# Patient Record
Sex: Male | Born: 1970 | Race: Black or African American | Hispanic: No | Marital: Single | Smoking: Never smoker
Health system: Southern US, Community
[De-identification: ages and names within clinical notes are randomized; demographics above are authoritative.]

## PROBLEM LIST (undated history)

## (undated) DIAGNOSIS — I10 Essential (primary) hypertension: Secondary | ICD-10-CM

## (undated) DIAGNOSIS — N5089 Other specified disorders of the male genital organs: Secondary | ICD-10-CM

## (undated) DIAGNOSIS — I639 Cerebral infarction, unspecified: Secondary | ICD-10-CM

## (undated) DIAGNOSIS — N451 Epididymitis: Secondary | ICD-10-CM

## (undated) DIAGNOSIS — E559 Vitamin D deficiency, unspecified: Secondary | ICD-10-CM

## (undated) DIAGNOSIS — R369 Urethral discharge, unspecified: Secondary | ICD-10-CM

## (undated) DIAGNOSIS — F32A Depression, unspecified: Secondary | ICD-10-CM

## (undated) DIAGNOSIS — A6000 Herpesviral infection of urogenital system, unspecified: Secondary | ICD-10-CM

## (undated) DIAGNOSIS — N4340 Spermatocele of epididymis, unspecified: Secondary | ICD-10-CM

## (undated) DIAGNOSIS — N401 Enlarged prostate with lower urinary tract symptoms: Secondary | ICD-10-CM

## (undated) DIAGNOSIS — N138 Other obstructive and reflux uropathy: Secondary | ICD-10-CM

## (undated) DIAGNOSIS — A64 Unspecified sexually transmitted disease: Secondary | ICD-10-CM

## (undated) DIAGNOSIS — F329 Major depressive disorder, single episode, unspecified: Secondary | ICD-10-CM

## (undated) HISTORY — PX: REFRACTIVE SURGERY: SHX103

## (undated) HISTORY — DX: Other obstructive and reflux uropathy: N40.1

## (undated) HISTORY — DX: Urethral discharge, unspecified: R36.9

## (undated) HISTORY — DX: Major depressive disorder, single episode, unspecified: F32.9

## (undated) HISTORY — DX: Depression, unspecified: F32.A

## (undated) HISTORY — DX: Other specified disorders of the male genital organs: N50.89

## (undated) HISTORY — DX: Spermatocele of epididymis, unspecified: N43.40

## (undated) HISTORY — DX: Herpesviral infection of urogenital system, unspecified: A60.00

## (undated) HISTORY — DX: Epididymitis: N45.1

## (undated) HISTORY — DX: Unspecified sexually transmitted disease: A64

## (undated) HISTORY — DX: Essential (primary) hypertension: I10

## (undated) HISTORY — DX: Other obstructive and reflux uropathy: N13.8

## (undated) HISTORY — PX: CIRCUMCISION: SUR203

---

## 2004-10-25 ENCOUNTER — Emergency Department: Payer: Self-pay | Admitting: Emergency Medicine

## 2004-11-02 ENCOUNTER — Emergency Department: Payer: Self-pay | Admitting: Emergency Medicine

## 2005-08-08 ENCOUNTER — Emergency Department: Payer: Self-pay | Admitting: Unknown Physician Specialty

## 2010-04-04 ENCOUNTER — Emergency Department: Payer: Self-pay | Admitting: Emergency Medicine

## 2011-08-12 ENCOUNTER — Emergency Department: Payer: Self-pay | Admitting: Emergency Medicine

## 2012-12-29 ENCOUNTER — Ambulatory Visit: Payer: Self-pay

## 2014-10-26 ENCOUNTER — Ambulatory Visit: Payer: Self-pay | Admitting: Urology

## 2015-09-15 ENCOUNTER — Other Ambulatory Visit: Payer: Self-pay | Admitting: Neurology

## 2015-09-15 DIAGNOSIS — R413 Other amnesia: Secondary | ICD-10-CM

## 2015-10-06 ENCOUNTER — Ambulatory Visit
Admission: RE | Admit: 2015-10-06 | Discharge: 2015-10-06 | Disposition: A | Discharge status: Home / Self Care | Payer: Managed Care, Other (non HMO) | Source: Ambulatory Visit | Attending: Neurology | Admitting: Neurology

## 2015-10-06 DIAGNOSIS — R410 Disorientation, unspecified: Secondary | ICD-10-CM | POA: Insufficient documentation

## 2015-10-06 DIAGNOSIS — R413 Other amnesia: Secondary | ICD-10-CM

## 2015-10-06 DIAGNOSIS — R4189 Other symptoms and signs involving cognitive functions and awareness: Secondary | ICD-10-CM | POA: Diagnosis present

## 2015-10-06 DIAGNOSIS — G9389 Other specified disorders of brain: Secondary | ICD-10-CM | POA: Diagnosis not present

## 2015-11-08 ENCOUNTER — Ambulatory Visit: Payer: Self-pay | Admitting: Urology

## 2015-11-10 ENCOUNTER — Encounter: Payer: Self-pay | Admitting: *Deleted

## 2015-11-21 ENCOUNTER — Ambulatory Visit: Payer: Self-pay | Admitting: Urology

## 2021-06-09 HISTORY — PX: COLONOSCOPY: SHX174

## 2021-07-04 ENCOUNTER — Inpatient Hospital Stay: Payer: Commercial Managed Care - PPO

## 2021-07-04 ENCOUNTER — Inpatient Hospital Stay: Payer: Commercial Managed Care - PPO | Attending: Oncology | Admitting: Oncology

## 2021-07-04 ENCOUNTER — Other Ambulatory Visit: Payer: Self-pay

## 2021-07-04 ENCOUNTER — Encounter: Payer: Self-pay | Admitting: Oncology

## 2021-07-04 VITALS — BP 166/106 | HR 64 | Temp 96.9°F | Ht 69.0 in | Wt 203.0 lb

## 2021-07-04 DIAGNOSIS — C20 Malignant neoplasm of rectum: Secondary | ICD-10-CM

## 2021-07-04 LAB — CBC WITH DIFFERENTIAL/PLATELET
Abs Immature Granulocytes: 0.03 10*3/uL (ref 0.00–0.07)
Basophils Absolute: 0.1 10*3/uL (ref 0.0–0.1)
Basophils Relative: 1 %
Eosinophils Absolute: 0.2 10*3/uL (ref 0.0–0.5)
Eosinophils Relative: 2 %
HCT: 39.5 % (ref 39.0–52.0)
Hemoglobin: 12.9 g/dL — ABNORMAL LOW (ref 13.0–17.0)
Immature Granulocytes: 0 %
Lymphocytes Relative: 30 %
Lymphs Abs: 2.2 10*3/uL (ref 0.7–4.0)
MCH: 26.9 pg (ref 26.0–34.0)
MCHC: 32.7 g/dL (ref 30.0–36.0)
MCV: 82.3 fL (ref 80.0–100.0)
Monocytes Absolute: 0.5 10*3/uL (ref 0.1–1.0)
Monocytes Relative: 6 %
Neutro Abs: 4.5 10*3/uL (ref 1.7–7.7)
Neutrophils Relative %: 61 %
Platelets: 225 10*3/uL (ref 150–400)
RBC: 4.8 MIL/uL (ref 4.22–5.81)
RDW: 13.6 % (ref 11.5–15.5)
WBC: 7.5 10*3/uL (ref 4.0–10.5)
nRBC: 0 % (ref 0.0–0.2)

## 2021-07-04 LAB — COMPREHENSIVE METABOLIC PANEL
ALT: 17 U/L (ref 0–44)
AST: 17 U/L (ref 15–41)
Albumin: 4 g/dL (ref 3.5–5.0)
Alkaline Phosphatase: 67 U/L (ref 38–126)
Anion gap: 6 (ref 5–15)
BUN: 17 mg/dL (ref 6–20)
CO2: 30 mmol/L (ref 22–32)
Calcium: 8.7 mg/dL — ABNORMAL LOW (ref 8.9–10.3)
Chloride: 100 mmol/L (ref 98–111)
Creatinine, Ser: 1.11 mg/dL (ref 0.61–1.24)
GFR, Estimated: >60 mL/min (ref >60)
Glucose, Bld: 107 mg/dL — ABNORMAL HIGH (ref 70–99)
Potassium: 3.6 mmol/L (ref 3.5–5.1)
Sodium: 136 mmol/L (ref 135–145)
Total Bilirubin: 0.3 mg/dL (ref 0.3–1.2)
Total Protein: 7.4 g/dL (ref 6.5–8.1)

## 2021-07-04 NOTE — Progress Notes (Signed)
Hematology/Oncology Consult note [facility] [phone] [phone]   Patient Care Team: [facility] as PCP - General Delia Chimes, Dorathy Daft, RN as Oncology Nurse Navigator  REFERRING PROVIDER: Geanie Kenning, [place]*  CHIEF COMPLAINTS/REASON FOR VISIT:  Evaluation of mid rectal cancer  HISTORY OF PRESENTING ILLNESS:   Joseph Valentine is a  50 y.o.  male with PMH listed below was seen in consultation at the request of  Geanie Kenning, [place]*  for evaluation of rectal cancer  Patient establish care with gastroenterology.  He has noticed occasional blood in the stool.  Symptoms are better after he changes his diet.  No intentional weight loss, abdominal pain. Patient was recommended to have colonoscopy screening.  He has no known family history of colon cancer or any cancer.  No inflammatory bowel disease 06/25/2021, patient underwent colonoscopy by Dr. Alice Reichert  Nonbleeding internal hemorrhoids 4 mm cecum polyp removed-pathology showed tubular adenoma 21mm polyp in the proximal sigmoid colon completely resected and partially retrieved.  Clip was placed.-Pathology showed tubular adenoma 8 mm polyp in the proximal rectum removed with a hot snare.-Pathology showed tubular adenoma 10 mm polyp in the mid rectum, removed with a hot snare.  Pathology showed tubulovillous adenoma Likely malignant tumor in the mid rectum, biopsied, tattooed.-Pathology showed moderately differentiated adenocarcinoma.  MSI testing in progress   Review of Systems  Constitutional:  Negative for appetite change, chills, fatigue, fever and unexpected weight change.  HENT:   Negative for hearing loss and voice change.   Eyes:  Negative for eye problems and icterus.  Respiratory:  Negative for chest tightness, cough and shortness of breath.   Cardiovascular:  Negative for chest pain and leg swelling.  Gastrointestinal:  Negative for abdominal  distention and abdominal pain.  Endocrine: Negative for hot flashes.  Genitourinary:  Negative for difficulty urinating, dysuria and frequency.   Musculoskeletal:  Negative for arthralgias.  Skin:  Negative for itching and rash.  Neurological:  Negative for light-headedness and numbness.  Hematological:  Negative for adenopathy. Does not bruise/bleed easily.  Psychiatric/Behavioral:  Negative for confusion.    MEDICAL HISTORY:  Past Medical History:  Diagnosis Date   BPH with obstruction/lower urinary tract symptoms    Depression    Epididymal mass    Epididymitis    Genital herpes    Type ll   HTN (hypertension)    Penile discharge    Spermatocele    Swelling of the testicles    Venereal disease     SURGICAL HISTORY: Past Surgical History:  Procedure Laterality Date   CIRCUMCISION      SOCIAL HISTORY: Social History   Socioeconomic History   Marital status: Single    Spouse name: Not on file   Number of children: Not on file   Years of education: Not on file   Highest education level: Not on file  Occupational History   Not on file  Tobacco Use   Smoking status: Never   Smokeless tobacco: Never  Vaping Use   Vaping Use: Never used  Substance and Sexual Activity   Alcohol use: No    Alcohol/week: 0.0 standard drinks   Drug use: Never   Sexual activity: Yes  Other Topics Concern   Not on file  Social History Narrative   Not on file   Social Determinants of Health   Financial Resource Strain: Not on file  Food Insecurity: Not on file  Transportation Needs: Not on file  Physical Activity: Not on file  Stress: Not on file  Social Connections: Not on file  Intimate Partner Violence: Not on file    FAMILY HISTORY: Family History  Problem Relation Age of Onset   Hypertension Father    Kidney disease Neg Hx    Prostate cancer Neg Hx     ALLERGIES:  is allergic to lamisil [terbinafine].  MEDICATIONS:  Current Outpatient Medications  Medication  Sig Dispense Refill   aspirin 81 MG EC tablet Take by mouth. Take 1 tablet (81 mg total) by mouth once daily     lisinopril-hydrochlorothiazide (ZESTORETIC) 10-12.5 MG tablet Take 1 tablet by mouth daily.  By Mouth Daily     metoprolol tartrate (LOPRESSOR) 25 MG tablet Take 25 mg by mouth daily. 1 Tablet(s) By Mouth Daily     simvastatin (ZOCOR) 20 MG tablet 1 Tablet(s) By Mouth Every Evening     No current facility-administered medications for this visit.     PHYSICAL EXAMINATION: ECOG PERFORMANCE STATUS: 0 - Asymptomatic Vitals:   07/04/21 1001  BP: (!) 166/106  Pulse: 64  Temp: (!) 96.9 F (36.1 C)  SpO2: 98%   Filed Weights   07/04/21 1001  Weight: 203 lb (92.1 kg)    Physical Exam Constitutional:      General: He is not in acute distress.    Appearance: He is obese.  HENT:     Head: Normocephalic and atraumatic.  Eyes:     General: No scleral icterus. Cardiovascular:     Rate and Rhythm: Normal rate and regular rhythm.     Heart sounds: Normal heart sounds.  Pulmonary:     Effort: Pulmonary effort is normal. No respiratory distress.     Breath sounds: No wheezing.  Abdominal:     General: Bowel sounds are normal. There is no distension.     Palpations: Abdomen is soft.  Musculoskeletal:        General: No deformity. Normal range of motion.     Cervical back: Normal range of motion and neck supple.  Skin:    General: Skin is warm and dry.     Findings: No erythema or rash.  Neurological:     Mental Status: He is alert and oriented to person, place, and time. Mental status is at baseline.     Cranial Nerves: No cranial nerve deficit.     Coordination: Coordination normal.  Psychiatric:        Mood and Affect: Mood normal.    LABORATORY DATA:  I have reviewed the data as listed Lab Results  Component Value Date   WBC 7.5 07/04/2021   HGB 12.9 (L) 07/04/2021   HCT 39.5 07/04/2021   MCV 82.3 07/04/2021   PLT 225 07/04/2021   Recent Labs     07/04/21 1045  NA 136  K 3.6  CL 100  CO2 30  GLUCOSE 107*  BUN 17  CREATININE 1.11  CALCIUM 8.7*  GFRNONAA >60  PROT 7.4  ALBUMIN 4.0  AST 17  ALT 17  ALKPHOS 67  BILITOT 0.3   Iron/TIBC/Ferritin/ %Sat No results found for: IRON, TIBC, FERRITIN, IRONPCTSAT    RADIOGRAPHIC STUDIES: I have personally reviewed the radiological images as listed and agreed with the findings in the report. No results found.    ASSESSMENT & PLAN:  1. Rectal cancer ([place])    #Newly diagnosed rectal cancer Diagnosis was reviewed and discussed with patient. I recommend patient to get CT chest and abdomen with contrast and  MRI back pelvis rectal protocol for further staging. Check CBC, CMP, CEA Further plan is dependent on staging results. Orders Placed This Encounter  Procedures   MR PELVIS W WO CONTRAST    Standing Status:   Future    Standing Expiration Date:   07/04/2022    Order Specific Question:   If indicated for the ordered procedure, I authorize the administration of contrast media per Radiology protocol    Answer:   Yes    Order Specific Question:   What is the patient's sedation requirement?    Answer:   No Sedation    Order Specific Question:   Does the patient have a pacemaker or implanted devices?    Answer:   Yes    Order Specific Question:   Preferred imaging location?    Answer:   [facility] (table limit - 550lbs)   CT Chest W Contrast    Standing Status:   Future    Standing Expiration Date:   07/04/2022    Order Specific Question:   If indicated for the ordered procedure, I authorize the administration of contrast media per Radiology protocol    Answer:   Yes    Order Specific Question:   Preferred imaging location?    Answer:   [facility]   CT Abdomen W Contrast    Standing Status:   Future    Standing Expiration Date:   07/04/2022    Order Specific Question:   If indicated for the ordered procedure, I authorize the administration of contrast media  per Radiology protocol    Answer:   Yes    Order Specific Question:   Preferred imaging location?    [facility]    Order Specific Question:   Is Oral Contrast requested for this exam?    Answer:   Yes, Per Radiology protocol   CBC with Differential/Platelet    Standing Status:   Future    Number of Occurrences:   1    Standing Expiration Date:   07/04/2022   Comprehensive metabolic panel    Standing Status:   Future    Number of Occurrences:   1    Standing Expiration Date:   07/04/2022   CEA    Standing Status:   Future    Number of Occurrences:   1    Standing Expiration Date:   07/04/2022    All questions were answered. The patient knows to call the clinic with any problems questions or concerns.   Geanie Kenning, [place]*    Return of visit: MD visit after CT and MRI to review results. Thank you for this kind referral and the opportunity to participate in the care of this patient. A copy of today's note is routed to referring provider    Earlie Server, MD, PhD Hematology Oncology [place] [facility]  07/04/2021

## 2021-07-05 ENCOUNTER — Ambulatory Visit
Admission: RE | Admit: 2021-07-05 | Discharge: 2021-07-05 | Disposition: A | Discharge status: Home / Self Care | Payer: Commercial Managed Care - PPO | Source: Ambulatory Visit | Attending: Oncology | Admitting: Oncology

## 2021-07-05 DIAGNOSIS — C20 Malignant neoplasm of rectum: Secondary | ICD-10-CM | POA: Diagnosis not present

## 2021-07-05 LAB — CEA: CEA: 0.6 ng/mL (ref 0.0–4.7)

## 2021-07-06 ENCOUNTER — Telehealth: Payer: Self-pay

## 2021-07-06 NOTE — Telephone Encounter (Signed)
Pt scheduled for CT on 11/3. Please move up MD appt to be a few days after CT for review of MRI/ CT results. Please notify pt of appt. Thanks

## 2021-07-12 ENCOUNTER — Ambulatory Visit
Admission: RE | Admit: 2021-07-12 | Discharge: 2021-07-12 | Disposition: A | Discharge status: Home / Self Care | Payer: Commercial Managed Care - PPO | Source: Ambulatory Visit | Attending: Oncology | Admitting: Oncology

## 2021-07-12 ENCOUNTER — Other Ambulatory Visit: Payer: Self-pay

## 2021-07-12 DIAGNOSIS — C20 Malignant neoplasm of rectum: Secondary | ICD-10-CM | POA: Diagnosis not present

## 2021-07-12 MED ORDER — IOHEXOL 300 MG/ML  SOLN
100.0000 mL | Freq: Once | INTRAMUSCULAR | Status: AC | PRN
  Administered 2021-07-12: 100 mL via INTRAVENOUS

## 2021-07-16 ENCOUNTER — Ambulatory Visit: Payer: Self-pay | Admitting: General Surgery

## 2021-07-16 ENCOUNTER — Encounter: Payer: Self-pay | Admitting: Oncology

## 2021-07-16 ENCOUNTER — Other Ambulatory Visit: Payer: Self-pay

## 2021-07-16 ENCOUNTER — Inpatient Hospital Stay: Payer: Commercial Managed Care - PPO | Attending: Oncology | Admitting: Oncology

## 2021-07-16 VITALS — BP 162/93 | HR 75 | Temp 97.9°F | Resp 20 | Wt 202.4 lb

## 2021-07-16 DIAGNOSIS — C20 Malignant neoplasm of rectum: Secondary | ICD-10-CM | POA: Insufficient documentation

## 2021-07-16 DIAGNOSIS — N200 Calculus of kidney: Secondary | ICD-10-CM | POA: Insufficient documentation

## 2021-07-16 DIAGNOSIS — Z7189 Other specified counseling: Secondary | ICD-10-CM | POA: Diagnosis not present

## 2021-07-16 MED ORDER — ONDANSETRON HCL 8 MG PO TABS: 8.0000 mg | ORAL_TABLET | Freq: Two times a day (BID) | ORAL | 1 refills | Status: AC | PRN

## 2021-07-16 MED ORDER — LIDOCAINE-PRILOCAINE 2.5-2.5 % EX CREA: TOPICAL_CREAM | CUTANEOUS | 3 refills | Status: AC

## 2021-07-16 MED ORDER — PROCHLORPERAZINE MALEATE 10 MG PO TABS: 10.0000 mg | ORAL_TABLET | Freq: Four times a day (QID) | ORAL | 1 refills | Status: AC | PRN

## 2021-07-16 NOTE — Progress Notes (Signed)
START ON PATHWAY REGIMEN - Colorectal     A cycle is every 14 days:     Oxaliplatin      Leucovorin      Fluorouracil      Fluorouracil   **Always confirm dose/schedule in your pharmacy ordering system**  Patient Characteristics: Preoperative or Nonsurgical Candidate (Clinical Staging), Rectal, cT3 - cT4, cN0 or Any cT, cN+ Tumor Location: Rectal Therapeutic Status: Preoperative or Nonsurgical Candidate (Clinical Staging) AJCC T Category: cT4a AJCC N Category: cN1 AJCC M Category: cM0 AJCC 8 Stage Grouping: IIIB Intent of Therapy: Curative Intent, Discussed with Patient

## 2021-07-16 NOTE — Progress Notes (Signed)
Pt has no concerns/complaints at this time. 

## 2021-07-16 NOTE — Progress Notes (Signed)
Hematology/Oncology Progress note [phone] [phone]   Patient Care Team: [facility] as PCP - General Delia Chimes, Dorathy Daft, RN as Oncology Nurse Navigator  REFERRING PROVIDER: [facility]*  CHIEF COMPLAINTS/REASON FOR VISIT:  Follow up mid rectal cancer  HISTORY OF PRESENTING ILLNESS:   Joseph Valentine is a  50 y.o.  male with PMH listed below was seen in consultation at the request of  [facility]  for evaluation of rectal cancer  Patient establish care with gastroenterology.  He has noticed occasional blood in the stool.  Symptoms are better after he changes his diet.  No intentional weight loss, abdominal pain. Patient was recommended to have colonoscopy screening.  He has no known family history of colon cancer or any cancer.  No inflammatory bowel disease 06/25/2021, patient underwent colonoscopy by Dr. Alice Reichert  Nonbleeding internal hemorrhoids 4 mm cecum polyp removed-pathology showed tubular adenoma 24m polyp in the proximal sigmoid colon completely resected and partially retrieved.  Clip was placed.-Pathology showed tubular adenoma 8 mm polyp in the proximal rectum removed with a hot snare.-Pathology showed tubular adenoma 10 mm polyp in the mid rectum, removed with a hot snare.  Pathology showed tubulovillous adenoma Likely malignant tumor in the mid rectum, biopsied, tattooed.-Pathology showed moderately differentiated adenocarcinoma.  MSI testing in progress  INTERVAL HISTORY Joseph D FArizpeis a 50y.o. male who has above history reviewed by me today presents for follow up visit for management of stage III rectal cancer.  07/05/2021, MRI pelvis without contrast showed rectal tumor extension through muscularis propria, T4aN1 07/16/2021 CT chest abdomen with contrast showed no distant metastasis.  Kidney stone. Patient presents to discuss results and management plan.  No new  complaints.    Review of Systems  Constitutional:  Negative for appetite change, chills, fatigue, fever and unexpected weight change.  HENT:   Negative for hearing loss and voice change.   Eyes:  Negative for eye problems and icterus.  Respiratory:  Negative for chest tightness, cough and shortness of breath.   Cardiovascular:  Negative for chest pain and leg swelling.  Gastrointestinal:  Negative for abdominal distention and abdominal pain.  Endocrine: Negative for hot flashes.  Genitourinary:  Negative for difficulty urinating, dysuria and frequency.   Musculoskeletal:  Negative for arthralgias.  Skin:  Negative for itching and rash.  Neurological:  Negative for light-headedness and numbness.  Hematological:  Negative for adenopathy. Does not bruise/bleed easily.  Psychiatric/Behavioral:  Negative for confusion.    MEDICAL HISTORY:  Past Medical History:  Diagnosis Date   BPH with obstruction/lower urinary tract symptoms    Depression    Epididymal mass    Epididymitis    Genital herpes    Type ll   HTN (hypertension)    Penile discharge    Spermatocele    Swelling of the testicles    Venereal disease     SURGICAL HISTORY: Past Surgical History:  Procedure Laterality Date   CIRCUMCISION      SOCIAL HISTORY: Social History   Socioeconomic History   Marital status: Single    Spouse name: Not on file   Number of children: Not on file   Years of education: Not on file   Highest education level: Not on file  Occupational History   Not on file  Tobacco Use   Smoking status: Never   Smokeless tobacco: Never  Vaping Use   Vaping Use: Never used  Substance and Sexual  Activity   Alcohol use: No    Alcohol/week: 0.0 standard drinks   Drug use: Never   Sexual activity: Yes  Other Topics Concern   Not on file  Social History Narrative   Not on file   Social Determinants of Health   Financial Resource Strain: Not on file  Food Insecurity: Not on file   Transportation Needs: Not on file  Physical Activity: Not on file  Stress: Not on file  Social Connections: Not on file  Intimate Partner Violence: Not on file    FAMILY HISTORY: Family History  Problem Relation Age of Onset   Hypertension Father    Kidney disease Neg Hx    Prostate cancer Neg Hx     ALLERGIES:  is allergic to lamisil [terbinafine].  MEDICATIONS:  Current Outpatient Medications  Medication Sig Dispense Refill   aspirin 81 MG EC tablet Take by mouth. Take 1 tablet (81 mg total) by mouth once daily     lisinopril-hydrochlorothiazide (ZESTORETIC) 10-12.5 MG tablet Take 1 tablet by mouth daily.  By Mouth Daily     metoprolol tartrate (LOPRESSOR) 25 MG tablet Take 25 mg by mouth daily. 1 Tablet(s) By Mouth Daily     simvastatin (ZOCOR) 20 MG tablet 1 Tablet(s) By Mouth Every Evening     No current facility-administered medications for this visit.     PHYSICAL EXAMINATION: ECOG PERFORMANCE STATUS: 0 - Asymptomatic Vitals:   07/16/21 1041  BP: (!) 162/93  Pulse: 75  Resp: 20  Temp: 97.9 F (36.6 C)  SpO2: 97%   Filed Weights   07/16/21 1041  Weight: 202 lb 6.4 oz (91.8 kg)    Physical Exam Constitutional:      General: He is not in acute distress.    Appearance: He is obese.  HENT:     Head: Normocephalic and atraumatic.  Eyes:     General: No scleral icterus. Cardiovascular:     Rate and Rhythm: Normal rate and regular rhythm.     Heart sounds: Normal heart sounds.  Pulmonary:     Effort: Pulmonary effort is normal. No respiratory distress.     Breath sounds: No wheezing.  Abdominal:     General: Bowel sounds are normal. There is no distension.     Palpations: Abdomen is soft.  Musculoskeletal:        General: No deformity. Normal range of motion.     Cervical back: Normal range of motion and neck supple.  Skin:    General: Skin is warm and dry.     Findings: No erythema or rash.  Neurological:     Mental Status: He is alert and  oriented to person, place, and time. Mental status is at baseline.     Cranial Nerves: No cranial nerve deficit.     Coordination: Coordination normal.  Psychiatric:        Mood and Affect: Mood normal.    LABORATORY DATA:  I have reviewed the data as listed Lab Results  Component Value Date   WBC 7.5 07/04/2021   HGB 12.9 (L) 07/04/2021   HCT 39.5 07/04/2021   MCV 82.3 07/04/2021   PLT 225 07/04/2021   Recent Labs    07/04/21 1045  NA 136  K 3.6  CL 100  CO2 30  GLUCOSE 107*  BUN 17  CREATININE 1.11  CALCIUM 8.7*  GFRNONAA >60  PROT 7.4  ALBUMIN 4.0  AST 17  ALT 17  ALKPHOS 67  BILITOT 0.3  Iron/TIBC/Ferritin/ %Sat No results found for: IRON, TIBC, FERRITIN, IRONPCTSAT    RADIOGRAPHIC STUDIES: I have personally reviewed the radiological images as listed and agreed with the findings in the report. CT Chest W Contrast  Result Date: 07/13/2021 CLINICAL DATA:  Staging rectal cancer. EXAM: CT CHEST AND ABDOMEN WITH CONTRAST TECHNIQUE: Multidetector CT imaging of the chest and abdomen was performed following the standard protocol during bolus administration of intravenous contrast. CONTRAST:  1102m OMNIPAQUE IOHEXOL 300 MG/ML  SOLN COMPARISON:  MR pelvis 07/05/2021. FINDINGS: CT CHEST FINDINGS Cardiovascular: Atherosclerotic calcification of the aorta and coronary arteries. Heart size normal. No pericardial effusion. Mediastinum/Nodes: No pathologically enlarged mediastinal, hilar or axillary lymph nodes. Esophagus is unremarkable. Lungs/Pleura: Lungs are clear. No pleural fluid. Airway is unremarkable. Musculoskeletal: None. CT ABDOMEN FINDINGS Hepatobiliary: Liver and gallbladder are unremarkable. No biliary ductal dilatation. Pancreas: Negative. Spleen: Negative. Adrenals/Urinary Tract: Adrenal glands and right kidney are unremarkable. Stone in the lower pole left kidney. Kidneys are otherwise unremarkable. Stomach/Bowel: Stomach and visualized portions of the small  bowel, appendix and colon are unremarkable. Vascular/Lymphatic: Vascular structures are unremarkable. No pathologically enlarged lymph nodes. Other: No free fluid.  Mesenteries and peritoneum are unremarkable. Musculoskeletal: None. IMPRESSION: 1. No evidence of metastatic disease. 2. Left renal stone. 3. Aortic atherosclerosis (ICD10-I70.0). Coronary artery calcification. Electronically Signed   By: MLorin PicketM.D.   On: 07/13/2021 12:26   CT Abdomen W Contrast  Result Date: 07/13/2021 CLINICAL DATA:  Staging rectal cancer. EXAM: CT CHEST AND ABDOMEN WITH CONTRAST TECHNIQUE: Multidetector CT imaging of the chest and abdomen was performed following the standard protocol during bolus administration of intravenous contrast. CONTRAST:  1037mOMNIPAQUE IOHEXOL 300 MG/ML  SOLN COMPARISON:  MR pelvis 07/05/2021. FINDINGS: CT CHEST FINDINGS Cardiovascular: Atherosclerotic calcification of the aorta and coronary arteries. Heart size normal. No pericardial effusion. Mediastinum/Nodes: No pathologically enlarged mediastinal, hilar or axillary lymph nodes. Esophagus is unremarkable. Lungs/Pleura: Lungs are clear. No pleural fluid. Airway is unremarkable. Musculoskeletal: None. CT ABDOMEN FINDINGS Hepatobiliary: Liver and gallbladder are unremarkable. No biliary ductal dilatation. Pancreas: Negative. Spleen: Negative. Adrenals/Urinary Tract: Adrenal glands and right kidney are unremarkable. Stone in the lower pole left kidney. Kidneys are otherwise unremarkable. Stomach/Bowel: Stomach and visualized portions of the small bowel, appendix and colon are unremarkable. Vascular/Lymphatic: Vascular structures are unremarkable. No pathologically enlarged lymph nodes. Other: No free fluid.  Mesenteries and peritoneum are unremarkable. Musculoskeletal: None. IMPRESSION: 1. No evidence of metastatic disease. 2. Left renal stone. 3. Aortic atherosclerosis (ICD10-I70.0). Coronary artery calcification. Electronically Signed   By:  MeLorin Picket.D.   On: 07/13/2021 12:26   MR PELVIS WO CONTRAST  Result Date: 07/05/2021 CLINICAL DATA:  A [age]ear old male presents with history of rectal cancer discovered on endoscopy. EXAM: MRI PELVIS WITHOUT CONTRAST TECHNIQUE: Multiplanar multisequence MR imaging of the pelvis was performed. No intravenous contrast was administered. Ultrasound gel was administered per rectum to optimize tumor evaluation. COMPARISON:  None FINDINGS: TUMOR LOCATION Tumor distance from Anal Verge/Skin Surface:  15 cm Tumor distance to Internal Anal Sphincter: 11 cm TUMOR DESCRIPTION Circumferential Extent: Eccentric irregular thickening of the RIGHT lateral wall of the rectum measuring 3.0 cm length and 2.3 cm in the oblique coronal plane along the RIGHT lateral rectal wall, distorting the RIGHT lateral rectal wall. Tumor Length: 3.0 cm T - CATEGORY Extension through Muscularis Propria: Yes (image 16/9 and image 19/10 between 2 and 3 mm extension beyond the RIGHT lateral rectal wall, on image 19/10 there is nodular extension beyond  the rectal wall just at the level of the APR or. Shortest Distance of any tumor/node from Mesorectal Fascia: 0 mm Extramural Vascular Invasion/Tumor Thrombus: No Invasion of Anterior Peritoneal Reflection: Yes Involvement of Adjacent Organs or Pelvic Sidewall: No Levator Ani Involvement: No N - CATEGORY Mesorectal Lymph Nodes >=40m: N1 disease with a small irregular appearing lymph node along course of superior rectal vein just above the APR (image 4/9) 7 x 5 mm Extra-mesorectal Lymphadenopathy: No Other:  None. IMPRESSION: T4aN1 rectosigmoid neoplasm occurring at the level of the anterior peritoneal reflection. Colonoscopy report is not available at the time of interpretation. No additional lesions are seen. Please correlate with any discordance. Colonoscopy report has been requested an addendum will be provided when this is available. Electronically Signed   By: GZetta BillsM.D.   On:  07/05/2021 11:35      ASSESSMENT & PLAN:  1. [place]   2. Goals of care, counseling/discussion   Cancer Staging Rectal cancer ([facility] Staging form: Colon and Rectum, AJCC 8th Edition - Clinical stage from 07/16/2021: Stage IIIB (cT4a, cN1, cM0) - Signed by YEarlie Server MD on 07/16/2021  #Stage III rectal cancer Imaging were independently reviewed by me and discussed with patient. Locally advanced rectal cancer.  Recommend total neoadjuvant therapy- FOLFOX chemotherapy followed by concurrent chemotherapy with Xeloda and radiation followed by surgery. The diagnosis and care plan were discussed with patient in detail.  NCCN guidelines were reviewed and shared with patient.   Curative intent.  Chemotherapy education was provided. I explained to the patient the risks and benefits of chemotherapy FOLFOX including all but not limited to hair loss, mouth sore, nausea, vomiting, diarrhea, low blood counts, bleeding, neuropathy and risk of life threatening infection and even death, secondary malignancy etc. Patient voices understanding and willing to proceed chemotherapy.   # Chemotherapy education; refer to surgery for Medi- port placement. Antiemetics-Zofran and Compazine; EMLA cream sent to pharmacy  #Kidney stone, patient is asymptomatic.  Observation pending.  Future urology evaluation. Supportive care measures are necessary for patient well-being and will be provided as necessary. We spent sufficient time to discuss many aspect of care, questions were answered to patient's satisfaction.    Orders Placed This Encounter  Procedures   Ambulatory referral to General Surgery    Referral Priority:   Routine    Referral Type:   Surgical    Referral Reason:   Specialty Services Required    Referred to Provider:   CHerbert Pun MD    Requested Specialty:   General Surgery    Number of Visits Requested:   1    All questions were answered. The patient knows to call the clinic with  any problems questions or concerns.   [facility]    Return of visit: 1 week To start treatment Thank you for this kind referral and the opportunity to participate in the care of this patient. A copy of today's note is routed to referring provider    ZEarlie Server MD, PhD Hematology Oncology  07/16/2021

## 2021-07-17 ENCOUNTER — Ambulatory Visit: Payer: Self-pay | Admitting: General Surgery

## 2021-07-17 NOTE — H&P (View-Only) (Signed)
PATIENT PROFILE: Joseph Valentine is a 50 y.o. male who presents to the Clinic for consultation at the request of Dr. Tasia Catchings for evaluation of insertion of Port-A-Cath.  PCP:  [facility]  HISTORY OF PRESENT ILLNESS: Joseph Valentine reports that he was diagnosed with rectal cancer.  He was having symptoms of rectal bleeding.  He denies weight loss.  He denies abdominal pain.  He had a colonoscopy that showed a malignant tumor of the mid rectum.  Biopsy showed invasive adenocarcinoma.  MRI showed extension of the tumor to the muscularis propria.  Patient was provided by medical oncology.  Neoadjuvant chemoradiation was recommended.  Patient was consulted for insertion of Port-A-Cath.   PROBLEM LIST: Problem List  Date Reviewed: 12/27/2020          Noted   HTN (hypertension) Unknown    GENERAL REVIEW OF SYSTEMS:   General ROS: negative for - chills, fatigue, fever, weight gain or weight loss Allergy and Immunology ROS: negative for - hives  Hematological and Lymphatic ROS: negative for - bleeding problems or bruising, negative for palpable nodes Endocrine ROS: negative for - heat or cold intolerance, hair changes Respiratory ROS: negative for - cough, shortness of breath or wheezing Cardiovascular ROS: no chest pain or palpitations GI ROS: negative for nausea, vomiting, abdominal pain, diarrhea, constipation Musculoskeletal ROS: negative for - joint swelling or muscle pain Neurological ROS: negative for - confusion, syncope Dermatological ROS: negative for pruritus and rash Psychiatric: negative for anxiety, depression, difficulty sleeping and memory loss  MEDICATIONS: Current Outpatient Medications  Medication Sig Dispense Refill   acetaminophen (TYLENOL) 500 MG tablet Take by mouth     aspirin 81 MG EC tablet Take 1 tablet (81 mg total) by mouth once daily 90 tablet 3   lisinopriL-hydrochlorothiazide (ZESTORETIC) 10-12.5 mg tablet Take 1 tablet by mouth once daily 90  tablet 3   metoprolol tartrate (LOPRESSOR) 25 MG tablet Take 0.5 tablets (12.5 mg total) by mouth 2 (two) times daily 90 tablet 3   RESTASIS 0.05 % ophthalmic emulsion INT 1 GTT INTO OU BID     simvastatin (ZOCOR) 20 MG tablet Take by mouth     valACYclovir (VALTREX) 500 MG tablet Take 500 mg by mouth once daily (Patient not taking: Reported on 04/20/2021)     No current facility-administered medications for this visit.    ALLERGIES: Lamisil  [terbinafine hcl] and Terbinafine  PAST MEDICAL HISTORY: Past Medical History:  Diagnosis Date   BPH (benign prostatic hypertrophy)    HTN (hypertension)     PAST SURGICAL HISTORY: Past Surgical History:  Procedure Laterality Date   COLONOSCOPY  06/25/2021   Moderately differentiated adenocarcinoma/TA/TVA/Will decide repeat once surgery and oncology recommendations are given.   OTHER SURGERY     eye surgery     FAMILY HISTORY: Family History  Problem Relation Age of Onset   No Known Problems Mother    No Known Problems Father    Colon cancer Neg Hx    Colon polyps Neg Hx      SOCIAL HISTORY: Social History   Socioeconomic History   Marital status: Single  Tobacco Use   Smoking status: Never   Smokeless tobacco: Never  Vaping Use   Vaping Use: Never used  Substance and Sexual Activity   Alcohol use: No   Drug use: No  Social History Narrative   Education: HS   Occupation: Office manager   Hobbies: notlistedatus: single    PHYSICAL EXAM: Vitals:   07/17/21  1159  BP: (!) 178/101  Pulse: 74   Body mass index is 29.53 kg/m. Weight: 90.7 kg (200 lb)   GENERAL: Alert, active, oriented x3  HEENT: Pupils equal reactive to light. Extraocular movements are intact. Sclera clear. Palpebral conjunctiva normal red color.Pharynx clear.  NECK: Supple with no palpable mass and no adenopathy.  LUNGS: Sound clear with no rales rhonchi or wheezes.  HEART: Regular rhythm S1 and S2 without murmur.  ABDOMEN: Soft and depressible,  nontender with no palpable mass, no hepatomegaly.   EXTREMITIES: Well-developed well-nourished symmetrical with no dependent edema.  NEUROLOGICAL: Awake alert oriented, facial expression symmetrical, moving all extremities.  REVIEW OF DATA: I have reviewed the following data today: No visits with results within 3 Month(s) from this visit.  Latest known visit with results is:  Appointment on 01/18/2021  Component Date Value   Cholesterol, Total 01/18/2021 171    Triglyceride 01/18/2021 142    HDL (High Density Lipopr* 01/18/2021 34.3    LDL Calculated 01/18/2021 108    VLDL Cholesterol 01/18/2021 28    Cholesterol/HDL Ratio 01/18/2021 5.0      ASSESSMENT: Joseph Valentine is a 50 y.o. male presenting for consultation for insertion of Port-A-Cath.    Patient was oriented about the procedure of insertion of Port-A-Cath.  He was oriented about what was a Chemo-Port.  He was oriented about the surgical management, the benefits and the risks.  He was notified about the events including bleeding, infection, hemothorax, pneumothorax, arteriovenous fistula, among others.  Patient reported he understood and agreed to proceed  Rectal cancer (CMS-HCC) [C20]  PLAN: 1. Insertion of Port a Cath [phone], [id number], [id number]) 2. CBC, CMP done  3. Contact us if has any question or concern.    Patient verbalized understanding, all questions were answered, and were agreeable with the plan outlined above.    Herbert Pun, MD  Electronically signed by Herbert Pun, MD

## 2021-07-17 NOTE — H&P (Signed)
PATIENT PROFILE: Joseph Valentine is a 50 y.o. male who presents to the Clinic for consultation at the request of Dr. Tasia Catchings for evaluation of insertion of Port-A-Cath.  PCP:  [facility]  HISTORY OF PRESENT ILLNESS: Joseph Valentine reports that he was diagnosed with rectal cancer.  He was having symptoms of rectal bleeding.  He denies weight loss.  He denies abdominal pain.  He had a colonoscopy that showed a malignant tumor of the mid rectum.  Biopsy showed invasive adenocarcinoma.  MRI showed extension of the tumor to the muscularis propria.  Patient was provided by medical oncology.  Neoadjuvant chemoradiation was recommended.  Patient was consulted for insertion of Port-A-Cath.   PROBLEM LIST: Problem List  Date Reviewed: 12/27/2020          Noted   HTN (hypertension) Unknown    GENERAL REVIEW OF SYSTEMS:   General ROS: negative for - chills, fatigue, fever, weight gain or weight loss Allergy and Immunology ROS: negative for - hives  Hematological and Lymphatic ROS: negative for - bleeding problems or bruising, negative for palpable nodes Endocrine ROS: negative for - heat or cold intolerance, hair changes Respiratory ROS: negative for - cough, shortness of breath or wheezing Cardiovascular ROS: no chest pain or palpitations GI ROS: negative for nausea, vomiting, abdominal pain, diarrhea, constipation Musculoskeletal ROS: negative for - joint swelling or muscle pain Neurological ROS: negative for - confusion, syncope Dermatological ROS: negative for pruritus and rash Psychiatric: negative for anxiety, depression, difficulty sleeping and memory loss  MEDICATIONS: Current Outpatient Medications  Medication Sig Dispense Refill   acetaminophen (TYLENOL) 500 MG tablet Take by mouth     aspirin 81 MG EC tablet Take 1 tablet (81 mg total) by mouth once daily 90 tablet 3   lisinopriL-hydrochlorothiazide (ZESTORETIC) 10-12.5 mg tablet Take 1 tablet by mouth once daily 90  tablet 3   metoprolol tartrate (LOPRESSOR) 25 MG tablet Take 0.5 tablets (12.5 mg total) by mouth 2 (two) times daily 90 tablet 3   RESTASIS 0.05 % ophthalmic emulsion INT 1 GTT INTO OU BID     simvastatin (ZOCOR) 20 MG tablet Take by mouth     valACYclovir (VALTREX) 500 MG tablet Take 500 mg by mouth once daily (Patient not taking: Reported on 04/20/2021)     No current facility-administered medications for this visit.    ALLERGIES: Lamisil  [terbinafine hcl] and Terbinafine  PAST MEDICAL HISTORY: Past Medical History:  Diagnosis Date   BPH (benign prostatic hypertrophy)    HTN (hypertension)     PAST SURGICAL HISTORY: Past Surgical History:  Procedure Laterality Date   COLONOSCOPY  06/25/2021   Moderately differentiated adenocarcinoma/TA/TVA/Will decide repeat once surgery and oncology recommendations are given.   OTHER SURGERY     eye surgery     FAMILY HISTORY: Family History  Problem Relation Age of Onset   No Known Problems Mother    No Known Problems Father    Colon cancer Neg Hx    Colon polyps Neg Hx      SOCIAL HISTORY: Social History   Socioeconomic History   Marital status: Single  Tobacco Use   Smoking status: Never   Smokeless tobacco: Never  Vaping Use   Vaping Use: Never used  Substance and Sexual Activity   Alcohol use: No   Drug use: No  Social History Narrative   Education: HS   Occupation: Office manager   Hobbies: notlistedatus: single    PHYSICAL EXAM: Vitals:   07/17/21  1159  BP: (!) 178/101  Pulse: 74   Body mass index is 29.53 kg/m. Weight: 90.7 kg (200 lb)   GENERAL: Alert, active, oriented x3  HEENT: Pupils equal reactive to light. Extraocular movements are intact. Sclera clear. Palpebral conjunctiva normal red color.Pharynx clear.  NECK: Supple with no palpable mass and no adenopathy.  LUNGS: Sound clear with no rales rhonchi or wheezes.  HEART: Regular rhythm S1 and S2 without murmur.  ABDOMEN: Soft and depressible,  nontender with no palpable mass, no hepatomegaly.   EXTREMITIES: Well-developed well-nourished symmetrical with no dependent edema.  NEUROLOGICAL: Awake alert oriented, facial expression symmetrical, moving all extremities.  REVIEW OF DATA: I have reviewed the following data today: No visits with results within 3 Month(s) from this visit.  Latest known visit with results is:  Appointment on 01/18/2021  Component Date Value   Cholesterol, Total 01/18/2021 171    Triglyceride 01/18/2021 142    HDL (High Density Lipopr* 01/18/2021 34.3    LDL Calculated 01/18/2021 108    VLDL Cholesterol 01/18/2021 28    Cholesterol/HDL Ratio 01/18/2021 5.0      ASSESSMENT: Joseph Valentine is a 50 y.o. male presenting for consultation for insertion of Port-A-Cath.    Patient was oriented about the procedure of insertion of Port-A-Cath.  He was oriented about what was a Chemo-Port.  He was oriented about the surgical management, the benefits and the risks.  He was notified about the events including bleeding, infection, hemothorax, pneumothorax, arteriovenous fistula, among others.  Patient reported he understood and agreed to proceed  Rectal cancer (CMS-HCC) [C20]  PLAN: 1. Insertion of Port a Cath [phone], [id number], [id number]) 2. CBC, CMP done  3. Contact us if has any question or concern.    Patient verbalized understanding, all questions were answered, and were agreeable with the plan outlined above.    Herbert Pun, MD  Electronically signed by Herbert Pun, MD

## 2021-07-18 ENCOUNTER — Encounter
Admission: RE | Admit: 2021-07-18 | Discharge: 2021-07-18 | Disposition: A | Discharge status: Home / Self Care | Payer: Commercial Managed Care - PPO | Source: Ambulatory Visit | Attending: General Surgery | Admitting: General Surgery

## 2021-07-18 ENCOUNTER — Other Ambulatory Visit: Payer: Self-pay

## 2021-07-18 ENCOUNTER — Ambulatory Visit: Payer: Commercial Managed Care - PPO | Admitting: Oncology

## 2021-07-18 DIAGNOSIS — I10 Essential (primary) hypertension: Secondary | ICD-10-CM | POA: Insufficient documentation

## 2021-07-18 DIAGNOSIS — Z01810 Encounter for preprocedural cardiovascular examination: Secondary | ICD-10-CM | POA: Insufficient documentation

## 2021-07-18 HISTORY — DX: Cerebral infarction, unspecified: I63.9

## 2021-07-18 HISTORY — DX: Vitamin D deficiency, unspecified: E55.9

## 2021-07-18 NOTE — Patient Instructions (Addendum)
Your procedure is scheduled on: Friday, November 11 Report to the Registration Desk on the 1st floor of the [facility]. To find out your arrival time, please call [phone] between 1PM - 3PM on: Thursday, November 10  REMEMBER: Instructions that are not followed completely may result in serious medical risk, up to and including death; or upon the discretion of your surgeon and anesthesiologist your surgery may need to be rescheduled.  Do not eat food after midnight the night before surgery.  No gum chewing, lozengers or hard candies.  You may however, drink CLEAR liquids up to 2 hours before you are scheduled to arrive for your surgery. Do not drink anything within 2 hours of your scheduled arrival time.  Clear liquids include: - water  - apple juice without pulp - gatorade (not RED, PURPLE, OR BLUE) - black coffee or tea (Do NOT add milk or creamers to the coffee or tea) Do NOT drink anything that is not on this list.  TAKE THESE MEDICATIONS THE MORNING OF SURGERY WITH A SIP OF WATER:  Metoprolol  One week prior to surgery: Stop aspirin and Anti-inflammatories (NSAIDS) such as Advil, Aleve, Ibuprofen, Motrin, Naproxen, Naprosyn and Aspirin based products such as Excedrin, Goodys Powder, BC Powder. Stop ANY OVER THE COUNTER supplements until after surgery. You may however, continue to take Tylenol if needed for pain up until the day of surgery.  No Alcohol for 24 hours before or after surgery.  No Smoking including e-cigarettes for 24 hours prior to surgery.  No chewable tobacco products for at least 6 hours prior to surgery.  No nicotine patches on the day of surgery.  Do not use any "recreational" drugs for at least a week prior to your surgery.  Please be advised that the combination of cocaine and anesthesia may have negative outcomes, up to and including death. If you test positive for cocaine, your surgery will be cancelled.  On the morning of surgery brush your  teeth with toothpaste and water, you may rinse your mouth with mouthwash if you wish. Do not swallow any toothpaste or mouthwash.  Use CHG Soap as directed on instruction sheet.  Do not wear jewelry.  Do not wear lotions, powders, or perfumes.   Do not shave body from the neck down 48 hours prior to surgery just in case you cut yourself which could leave a site for infection.  Also, freshly shaved skin may become irritated if using the CHG soap.  Do not bring valuables to the hospital. [facility] is not responsible for any missing/lost belongings or valuables.   Notify your doctor if there is any change in your medical condition (cold, fever, infection).  Wear comfortable clothing (specific to your surgery type) to the hospital.  If you are being discharged the day of surgery, you will not be allowed to drive home. You will need a responsible adult (18 years or older) to drive you home and stay with you that night.   If you are taking public transportation, you will need to have a responsible adult (18 years or older) with you. Please confirm with your physician that it is acceptable to use public transportation.   Please call the [facility] at [phone] if you have any questions about these instructions.  Surgery Visitation Policy:  Patients undergoing a surgery or procedure may have one family member or support person with them as long as that person is not COVID-19 positive or experiencing its symptoms.  That person may remain in the waiting area during the procedure and may rotate out with other people.

## 2021-07-19 ENCOUNTER — Inpatient Hospital Stay: Payer: Commercial Managed Care - PPO

## 2021-07-19 MED ORDER — CEFAZOLIN SODIUM-DEXTROSE 2-4 GM/100ML-% IV SOLN
2.0000 g | INTRAVENOUS | Status: AC
  Administered 2021-07-20: 2 g via INTRAVENOUS

## 2021-07-19 MED ORDER — LACTATED RINGERS IV SOLN: INTRAVENOUS | Status: DC

## 2021-07-19 MED ORDER — FAMOTIDINE 20 MG PO TABS: 20.0000 mg | ORAL_TABLET | Freq: Once | ORAL | Status: AC

## 2021-07-19 MED ORDER — CHLORHEXIDINE GLUCONATE 0.12 % MT SOLN: 15.0000 mL | Freq: Once | OROMUCOSAL | Status: AC

## 2021-07-19 MED ORDER — ORAL CARE MOUTH RINSE: 15.0000 mL | Freq: Once | OROMUCOSAL | Status: AC

## 2021-07-20 ENCOUNTER — Encounter
Admission: RE | Disposition: A | Discharge status: Home / Self Care | Payer: Self-pay | Source: Home / Self Care | Attending: General Surgery

## 2021-07-20 ENCOUNTER — Ambulatory Visit: Payer: Commercial Managed Care - PPO

## 2021-07-20 ENCOUNTER — Ambulatory Visit: Payer: Commercial Managed Care - PPO | Admitting: Anesthesiology

## 2021-07-20 ENCOUNTER — Encounter: Payer: Self-pay | Admitting: General Surgery

## 2021-07-20 ENCOUNTER — Ambulatory Visit
Admission: RE | Admit: 2021-07-20 | Discharge: 2021-07-20 | Disposition: A | Discharge status: Home / Self Care | Payer: Commercial Managed Care - PPO | Attending: General Surgery | Admitting: General Surgery

## 2021-07-20 DIAGNOSIS — Z79899 Other long term (current) drug therapy: Secondary | ICD-10-CM | POA: Diagnosis not present

## 2021-07-20 DIAGNOSIS — Z95828 Presence of other vascular implants and grafts: Secondary | ICD-10-CM

## 2021-07-20 DIAGNOSIS — C20 Malignant neoplasm of rectum: Secondary | ICD-10-CM | POA: Insufficient documentation

## 2021-07-20 DIAGNOSIS — I10 Essential (primary) hypertension: Secondary | ICD-10-CM | POA: Insufficient documentation

## 2021-07-20 HISTORY — PX: PORTACATH PLACEMENT: SHX2246

## 2021-07-20 SURGERY — INSERTION, TUNNELED CENTRAL VENOUS DEVICE, WITH PORT
Anesthesia: General | Site: Chest | Laterality: Right

## 2021-07-20 MED ORDER — ACETAMINOPHEN 10 MG/ML IV SOLN: 1000.0000 mg | Freq: Once | INTRAVENOUS | Status: DC | PRN

## 2021-07-20 MED ORDER — BUPIVACAINE-EPINEPHRINE (PF) 0.25% -1:200000 IJ SOLN
INTRAMUSCULAR | Status: DC | PRN
  Administered 2021-07-20: 10 mL

## 2021-07-20 MED ORDER — BUPIVACAINE-EPINEPHRINE (PF) 0.25% -1:200000 IJ SOLN
INTRAMUSCULAR | Status: AC
  Filled 2021-07-20: qty 30

## 2021-07-20 MED ORDER — MIDAZOLAM HCL 2 MG/2ML IJ SOLN
INTRAMUSCULAR | Status: AC
  Filled 2021-07-20: qty 2

## 2021-07-20 MED ORDER — FENTANYL CITRATE (PF) 100 MCG/2ML IJ SOLN: 25.0000 ug | INTRAMUSCULAR | Status: DC | PRN

## 2021-07-20 MED ORDER — FENTANYL CITRATE (PF) 100 MCG/2ML IJ SOLN
INTRAMUSCULAR | Status: AC
  Filled 2021-07-20: qty 2

## 2021-07-20 MED ORDER — ONDANSETRON HCL 4 MG/2ML IJ SOLN
INTRAMUSCULAR | Status: DC | PRN
  Administered 2021-07-20: 4 mg via INTRAVENOUS

## 2021-07-20 MED ORDER — PROMETHAZINE HCL 25 MG/ML IJ SOLN: 6.2500 mg | INTRAMUSCULAR | Status: DC | PRN

## 2021-07-20 MED ORDER — PROPOFOL 10 MG/ML IV BOLUS
INTRAVENOUS | Status: DC | PRN
  Administered 2021-07-20: 200 mg via INTRAVENOUS

## 2021-07-20 MED ORDER — SODIUM CHLORIDE (PF) 0.9 % IJ SOLN
INTRAMUSCULAR | Status: AC
  Filled 2021-07-20: qty 50

## 2021-07-20 MED ORDER — CHLORHEXIDINE GLUCONATE 0.12 % MT SOLN
OROMUCOSAL | Status: AC
  Administered 2021-07-20: 15 mL via OROMUCOSAL
  Filled 2021-07-20: qty 15

## 2021-07-20 MED ORDER — HYDROCODONE-ACETAMINOPHEN 5-325 MG PO TABS: 1.0000 | ORAL_TABLET | ORAL | 0 refills | Status: AC | PRN

## 2021-07-20 MED ORDER — CEFAZOLIN SODIUM-DEXTROSE 2-4 GM/100ML-% IV SOLN
INTRAVENOUS | Status: AC
  Filled 2021-07-20: qty 100

## 2021-07-20 MED ORDER — OXYCODONE HCL 5 MG/5ML PO SOLN: 5.0000 mg | Freq: Once | ORAL | Status: DC | PRN

## 2021-07-20 MED ORDER — SODIUM CHLORIDE 0.9 % IV SOLN
INTRAVENOUS | Status: AC | PRN
  Administered 2021-07-20: 500 mL

## 2021-07-20 MED ORDER — EPHEDRINE 5 MG/ML INJ
INTRAVENOUS | Status: AC
  Filled 2021-07-20: qty 5

## 2021-07-20 MED ORDER — LIDOCAINE HCL (CARDIAC) PF 100 MG/5ML IV SOSY
PREFILLED_SYRINGE | INTRAVENOUS | Status: DC | PRN
  Administered 2021-07-20: 100 mg via INTRAVENOUS

## 2021-07-20 MED ORDER — EPHEDRINE SULFATE 50 MG/ML IJ SOLN
INTRAMUSCULAR | Status: DC | PRN
Start: 2021-07-20 — End: 2021-07-20
  Administered 2021-07-20: 10 mg via INTRAVENOUS

## 2021-07-20 MED ORDER — FENTANYL CITRATE (PF) 100 MCG/2ML IJ SOLN
INTRAMUSCULAR | Status: DC | PRN
  Administered 2021-07-20: 100 ug via INTRAVENOUS

## 2021-07-20 MED ORDER — OXYCODONE HCL 5 MG PO TABS: 5.0000 mg | ORAL_TABLET | Freq: Once | ORAL | Status: DC | PRN

## 2021-07-20 MED ORDER — FAMOTIDINE 20 MG PO TABS
ORAL_TABLET | ORAL | Status: AC
  Administered 2021-07-20: 20 mg via ORAL
  Filled 2021-07-20: qty 1

## 2021-07-20 MED ORDER — MIDAZOLAM HCL 2 MG/2ML IJ SOLN
INTRAMUSCULAR | Status: DC | PRN
  Administered 2021-07-20: 2 mg via INTRAVENOUS

## 2021-07-20 MED ORDER — SODIUM CHLORIDE 0.9 % IV SOLN
INTRAVENOUS | Status: DC | PRN
  Administered 2021-07-20: 17 mL via INTRAMUSCULAR

## 2021-07-20 MED ORDER — DEXAMETHASONE SODIUM PHOSPHATE 10 MG/ML IJ SOLN
INTRAMUSCULAR | Status: DC | PRN
  Administered 2021-07-20: 10 mg via INTRAVENOUS

## 2021-07-20 MED ORDER — HEPARIN SODIUM (PORCINE) 5000 UNIT/ML IJ SOLN
INTRAMUSCULAR | Status: AC
  Filled 2021-07-20: qty 1

## 2021-07-20 SURGICAL SUPPLY — 37 items
ADH SKN CLS APL DERMABOND .7 (GAUZE/BANDAGES/DRESSINGS) ×1
APL PRP STRL LF DISP 70% ISPRP (MISCELLANEOUS) ×1
BAG DECANTER FOR FLEXI CONT (MISCELLANEOUS) ×2 IMPLANT
BLADE SURG 11 STRL SS SAFETY (MISCELLANEOUS) ×2 IMPLANT
BLADE SURG SZ11 CARB STEEL (BLADE) ×2 IMPLANT
BOOT SUTURE AID YELLOW STND (SUTURE) ×2 IMPLANT
CHLORAPREP W/TINT 26 (MISCELLANEOUS) ×2 IMPLANT
COVER LIGHT HANDLE STERIS (MISCELLANEOUS) ×4 IMPLANT
DERMABOND ADVANCED (GAUZE/BANDAGES/DRESSINGS) ×1
DERMABOND ADVANCED .7 DNX12 (GAUZE/BANDAGES/DRESSINGS) ×1 IMPLANT
DRAPE C-ARM XRAY 36X54 (DRAPES) ×2 IMPLANT
ELECT REM PT RETURN 9FT ADLT (ELECTROSURGICAL) ×2
ELECTRODE REM PT RTRN 9FT ADLT (ELECTROSURGICAL) ×1 IMPLANT
GAUZE 4X4 16PLY ~~LOC~~+RFID DBL (SPONGE) ×2 IMPLANT
GLOVE SURG ENC MOIS LTX SZ6.5 (GLOVE) ×2 IMPLANT
GLOVE SURG UNDER POLY LF SZ6.5 (GLOVE) ×2 IMPLANT
GOWN STRL REUS W/ TWL LRG LVL3 (GOWN DISPOSABLE) ×3 IMPLANT
GOWN STRL REUS W/TWL LRG LVL3 (GOWN DISPOSABLE) ×6
IV NS 500ML (IV SOLUTION) ×2
IV NS 500ML BAXH (IV SOLUTION) ×1 IMPLANT
KIT PORT POWER 8FR ISP CVUE (Port) ×2 IMPLANT
KIT TURNOVER KIT A (KITS) ×2 IMPLANT
LABEL OR SOLS (LABEL) ×2 IMPLANT
MANIFOLD NEPTUNE II (INSTRUMENTS) ×2 IMPLANT
NDL FILTER BLUNT 18X1 1/2 (NEEDLE) ×1 IMPLANT
NEEDLE FILTER BLUNT 18X 1/2SAF (NEEDLE) ×1
NEEDLE FILTER BLUNT 18X1 1/2 (NEEDLE) ×1 IMPLANT
PACK PORT-A-CATH (MISCELLANEOUS) ×2 IMPLANT
SUT MNCRL AB 4-0 PS2 18 (SUTURE) ×2 IMPLANT
SUT PROLENE 2 0 FS (SUTURE) ×2 IMPLANT
SUT VIC AB 2-0 SH 27 (SUTURE) ×2
SUT VIC AB 2-0 SH 27XBRD (SUTURE) ×1 IMPLANT
SUT VIC AB 3-0 SH 27 (SUTURE) ×2
SUT VIC AB 3-0 SH 27X BRD (SUTURE) ×1 IMPLANT
SYR 10ML LL (SYRINGE) ×4 IMPLANT
SYR 3ML LL SCALE MARK (SYRINGE) ×2 IMPLANT
WATER STERILE IRR 500ML POUR (IV SOLUTION) ×2 IMPLANT

## 2021-07-20 NOTE — Discharge Instructions (Addendum)
  Diet: Resume home heart healthy regular diet.   Activity: Increase activity as tolerated. Light activity and walking are encouraged. Do not drive or drink alcohol if taking narcotic pain medications.  Wound care: May shower with soapy water and pat dry (do not rub incisions), but no baths or submerging incision underwater until follow-up. (no swimming)   Medications: Resume all home medications. For mild to moderate pain: acetaminophen (Tylenol) or ibuprofen (if no kidney disease). Combining Tylenol with alcohol can substantially increase your risk of causing liver disease. Narcotic pain medications, if prescribed, can be used for severe pain, though may cause nausea, constipation, and drowsiness. Do not combine Tylenol and Norco within a 6 hour period as Norco contains Tylenol. If you do not need the narcotic pain medication, you do not need to fill the prescription.  Call office ([phone]) at any time if any questions, worsening pain, fevers/chills, bleeding, drainage from incision site, or other concerns.   AMBULATORY SURGERY  DISCHARGE INSTRUCTIONS   The drugs that you were given will stay in your system until tomorrow so for the next 24 hours you should not:  Drive an automobile Make any legal decisions Drink any alcoholic beverage   You may resume regular meals tomorrow.  Today it is better to start with liquids and gradually work up to solid foods.  You may eat anything you prefer, but it is better to start with liquids, then soup and crackers, and gradually work up to solid foods.   Please notify your doctor immediately if you have any unusual bleeding, trouble breathing, redness and pain at the surgery site, drainage, fever, or pain not relieved by medication.    Additional Instructions:        Please contact your physician with any problems or Same Day Surgery at [phone], Monday through Friday 6 am to 4 pm, or [facility] number at  [phone]. 

## 2021-07-20 NOTE — Anesthesia Preprocedure Evaluation (Addendum)
Anesthesia Evaluation  Patient identified by MRN, date of birth, ID band Patient awake    Reviewed: Allergy & Precautions, NPO status , Patient's Chart, lab work & pertinent test results, reviewed documented beta blocker date and time   Airway Mallampati: III  TM Distance: >3 FB Neck ROM: Full    Dental no notable dental hx.    Pulmonary neg pulmonary ROS,    Pulmonary exam normal        Cardiovascular Exercise Tolerance: Good hypertension, Pt. on home beta blockers and Pt. on medications Normal cardiovascular exam     Neuro/Psych CVA (Congenital Stroke) negative psych ROS   GI/Hepatic negative GI ROS, Neg liver ROS,   Endo/Other  negative endocrine ROS  Renal/GU negative Renal ROS  negative genitourinary   Musculoskeletal negative musculoskeletal ROS (+)   Abdominal Normal abdominal exam  (+)   Peds negative pediatric ROS (+)  Hematology negative hematology ROS (+)   Anesthesia Other Findings Rectal cancer  Reproductive/Obstetrics negative OB ROS                            Anesthesia Physical Anesthesia Plan  ASA: 2  Anesthesia Plan: General   Post-op Pain Management:    Induction: Intravenous  PONV Risk Score and Plan: 2 and Ondansetron and Dexamethasone  Airway Management Planned: LMA  Additional Equipment:   Intra-op Plan:   Post-operative Plan: Extubation in OR  Informed Consent: I have reviewed the patients History and Physical, chart, labs and discussed the procedure including the risks, benefits and alternatives for the proposed anesthesia with the patient or authorized representative who has indicated his/her understanding and acceptance.     Dental advisory given  Plan Discussed with: CRNA and Anesthesiologist  Anesthesia Plan Comments:         Anesthesia Quick Evaluation

## 2021-07-20 NOTE — Anesthesia Procedure Notes (Signed)
Procedure Name: LMA Insertion Date/Time: 07/20/2021 12:07 PM Performed by: Esaw Grandchild, CRNA Pre-anesthesia Checklist: Patient identified, Emergency Drugs available, Suction available and Patient being monitored Patient Re-evaluated:Patient Re-evaluated prior to induction Oxygen Delivery Method: Circle system utilized Preoxygenation: Pre-oxygenation with 100% oxygen Induction Type: IV induction Ventilation: Mask ventilation without difficulty and Oral airway inserted - appropriate to patient size LMA: LMA inserted LMA Size: 5.0 Number of attempts: 1 Airway Equipment and Method: Patient positioned with wedge pillow Placement Confirmation: positive ETCO2 and breath sounds checked- equal and bilateral Tube secured with: Tape Dental Injury: Teeth and Oropharynx as per pre-operative assessment

## 2021-07-20 NOTE — Interval H&P Note (Signed)
History and Physical Interval Note:  07/20/2021 11:35 AM  Joseph Valentine  has presented today for surgery, with the diagnosis of C20 Rectal cancer.  The various methods of treatment have been discussed with the patient and family. After consideration of risks, benefits and other options for treatment, the patient has consented to  Procedure(s): INSERTION PORT-A-CATH (N/A) as a surgical intervention.  The patient's history has been reviewed, patient examined, no change in status, stable for surgery.  I have reviewed the patient's chart and labs.  Questions were answered to the patient's satisfaction.     Herbert Pun

## 2021-07-20 NOTE — Transfer of Care (Signed)
Immediate Anesthesia Transfer of Care Note  Patient: Joseph Valentine  Procedure(s) Performed: INSERTION PORT-A-CATH (Right: Chest)  Patient Location: PACU  Anesthesia Type:General  Level of Consciousness: drowsy  Airway & Oxygen Therapy: Patient Spontanous Breathing and Patient connected to face mask oxygen  Post-op Assessment: Report given to RN and Post -op Vital signs reviewed and stable  Post vital signs: Reviewed and stable  Last Vitals:  Vitals Value Taken Time  BP 144/92 07/20/21 1310  Temp    Pulse 64 07/20/21 1312  Resp 15 07/20/21 1312  SpO2 100 % 07/20/21 1312  Vitals shown include unvalidated device data.  Last Pain:  Vitals:   07/20/21 1151  TempSrc: Temporal  PainSc: 0-No pain      Patients Stated Pain Goal: 0 (45/40/98 1191)  Complications: No notable events documented.

## 2021-07-20 NOTE — Progress Notes (Signed)
Discharge instructions were given to pt and pt's girlfriend, Bethanie Dicker, she verbalized fully understanding of instructions. And questions were answered by this Rn. Continue to monitor.

## 2021-07-20 NOTE — Op Note (Signed)
SURGICAL PROCEDURE REPORT  DATE OF PROCEDURE: 07/20/2021   SURGEON: Dr. Windell Moment   ANESTHESIA: Local with light IV sedation   PRE-OPERATIVE DIAGNOSIS: Advanced Rectal cancer requiring durable central venous access for chemotherapy   POST-OPERATIVE DIAGNOSIS: Same  PROCEDURE(S):  1.) Percutaneous access of Right internal jugular vein under ultrasound guidance 2.) Insertion of tunneled Right internal jugular central venous catheter with subcutaneous port  INTRAOPERATIVE FINDINGS: Patent easily compressible Right internal jugular vein with appropriate respiratory variations and well-secured tunneled central venous catheter with subcutaneous port at completion of the procedure  ESTIMATED BLOOD LOSS: Minimal (<20 mL)   SPECIMENS: None   IMPLANTS: 62F tunneled Bard PowerPort central venous catheter with subcutaneous port  DRAINS: None   COMPLICATIONS: None apparent   CONDITION AT COMPLETION: Hemodynamically stable, awake   DISPOSITION: PACU   INDICATION(S) FOR PROCEDURE:  Patient is a 50 y.o. male who presented with advanced rectal cancer requiring durable central venous access for chemotherapy. All risks, benefits, and alternatives to above elective procedures were discussed with the patient, who elected to proceed, and informed consent was accordingly obtained at that time.  DETAILS OF PROCEDURE:  Patient was brought to the operative suite and appropriately identified. In Trendelenburg position, Right IJ venous access site was prepped and draped in the usual sterile fashion, and following a brief timeout, percutaneous Right IJ venous access was obtained under ultrasound guidance using Seldinger technique, by which local anesthetic was injected over the Right IJ vein, and access needle was inserted under direct ultrasound visualization into the Right IJ vein, through which soft guidewire was advanced, over which access needle was withdrawn. Guidewire was secured, attention was  directed to injection of local anesthetic along the planned tunnel site, 2-3 cm transverse Right chest incision was made and confirmed to accommodate the subcutaneous port, and flushed catheter was tunneled retrograde from the port site over the Right chest to the Right IJ access site with the attached port well-secured to the catheter and within the subcutaneous pocket. Insertion sheath was advanced over the guidewire, which was withdrawn along with the insertion sheath dilator. The catheter was introduced through the sheath and left on the [place]rio [place] junction under fluoro guidance and catheter cut to desire lenght. Catheter connected to port and fixed to the pocket on two side to avoid twisting. Port was confirmed to withdraw blood and flush easily, after which concentrated heparin was instilled into the port and catheter. Dermis at the subcutaneous pocket was re-approximated using buried interrupted 3-0 Vicryl suture, and 4-0 Monocryl suture was used to re-approximate skin at the insertion and subcutaneous port sites in running subcuticular fashion for the subcutaneous port and buried interrupted fashion for the insertion site. Skin was cleaned, dried, and sterile skin glue was applied. Patient was then safely transferred to PACU for a chest x-ray. Ultrasound images are available on paper chart and Fluoroscopy guidance images are available in Epic.

## 2021-07-22 ENCOUNTER — Encounter: Payer: Self-pay | Admitting: General Surgery

## 2021-07-23 ENCOUNTER — Inpatient Hospital Stay: Payer: Commercial Managed Care - PPO

## 2021-07-23 ENCOUNTER — Encounter: Payer: Self-pay | Admitting: Oncology

## 2021-07-23 ENCOUNTER — Inpatient Hospital Stay: Payer: Commercial Managed Care - PPO | Admitting: Oncology

## 2021-07-23 ENCOUNTER — Telehealth: Payer: Self-pay

## 2021-07-23 ENCOUNTER — Other Ambulatory Visit: Payer: Self-pay

## 2021-07-23 VITALS — BP 177/100 | HR 65 | Temp 97.2°F | Wt 202.0 lb

## 2021-07-23 DIAGNOSIS — I10 Essential (primary) hypertension: Secondary | ICD-10-CM

## 2021-07-23 DIAGNOSIS — Z7189 Other specified counseling: Secondary | ICD-10-CM

## 2021-07-23 DIAGNOSIS — C20 Malignant neoplasm of rectum: Secondary | ICD-10-CM | POA: Diagnosis not present

## 2021-07-23 LAB — CBC WITH DIFFERENTIAL/PLATELET
Abs Immature Granulocytes: 0.04 10*3/uL (ref 0.00–0.07)
Basophils Absolute: 0.1 10*3/uL (ref 0.0–0.1)
Basophils Relative: 1 %
Eosinophils Absolute: 0.2 10*3/uL (ref 0.0–0.5)
Eosinophils Relative: 2 %
HCT: 37.1 % — ABNORMAL LOW (ref 39.0–52.0)
Hemoglobin: 12.2 g/dL — ABNORMAL LOW (ref 13.0–17.0)
Immature Granulocytes: 0 %
Lymphocytes Relative: 22 %
Lymphs Abs: 2.1 10*3/uL (ref 0.7–4.0)
MCH: 26.9 pg (ref 26.0–34.0)
MCHC: 32.9 g/dL (ref 30.0–36.0)
MCV: 81.7 fL (ref 80.0–100.0)
Monocytes Absolute: 0.7 10*3/uL (ref 0.1–1.0)
Monocytes Relative: 7 %
Neutro Abs: 6.5 10*3/uL (ref 1.7–7.7)
Neutrophils Relative %: 68 %
Platelets: 214 10*3/uL (ref 150–400)
RBC: 4.54 MIL/uL (ref 4.22–5.81)
RDW: 13.8 % (ref 11.5–15.5)
WBC: 9.6 10*3/uL (ref 4.0–10.5)
nRBC: 0 % (ref 0.0–0.2)

## 2021-07-23 LAB — COMPREHENSIVE METABOLIC PANEL
ALT: 16 U/L (ref 0–44)
AST: 17 U/L (ref 15–41)
Albumin: 3.9 g/dL (ref 3.5–5.0)
Alkaline Phosphatase: 65 U/L (ref 38–126)
Anion gap: 7 (ref 5–15)
BUN: 19 mg/dL (ref 6–20)
CO2: 29 mmol/L (ref 22–32)
Calcium: 8.4 mg/dL — ABNORMAL LOW (ref 8.9–10.3)
Chloride: 99 mmol/L (ref 98–111)
Creatinine, Ser: 0.91 mg/dL (ref 0.61–1.24)
GFR, Estimated: >60 mL/min (ref >60)
Glucose, Bld: 101 mg/dL — ABNORMAL HIGH (ref 70–99)
Potassium: 3.5 mmol/L (ref 3.5–5.1)
Sodium: 135 mmol/L (ref 135–145)
Total Bilirubin: 0.4 mg/dL (ref 0.3–1.2)
Total Protein: 7.4 g/dL (ref 6.5–8.1)

## 2021-07-23 MED ORDER — SODIUM CHLORIDE 0.9% FLUSH
10.0000 mL | INTRAVENOUS | Status: DC | PRN
  Administered 2021-07-23: 10 mL via INTRAVENOUS
  Filled 2021-07-23: qty 10

## 2021-07-23 MED ORDER — HEPARIN SOD (PORK) LOCK FLUSH 100 UNIT/ML IV SOLN
INTRAVENOUS | Status: AC
  Administered 2021-07-23: 500 [IU] via INTRAVENOUS
  Filled 2021-07-23: qty 5

## 2021-07-23 MED ORDER — HEPARIN SOD (PORK) LOCK FLUSH 100 UNIT/ML IV SOLN
500.0000 [IU] | Freq: Once | INTRAVENOUS | Status: AC
  Filled 2021-07-23: qty 5

## 2021-07-23 NOTE — Progress Notes (Signed)
Hematology/Oncology Progress note [phone] [phone]   Patient Care Team: [facility] as PCP - General Delia Chimes, Dorathy Daft, RN as Oncology Nurse Navigator  REFERRING PROVIDER: [facility]  CHIEF COMPLAINTS/REASON FOR VISIT:  Follow up mid rectal cancer  HISTORY OF PRESENTING ILLNESS:   Joseph Valentine is a  50 y.o.  male with PMH listed below was seen in consultation at the request of  [facility]  for evaluation of rectal cancer  Patient establish care with gastroenterology.  He has noticed occasional blood in the stool.  Symptoms are better after he changes his diet.  No intentional weight loss, abdominal pain. Patient was recommended to have colonoscopy screening.  He has no known family history of colon cancer or any cancer.  No inflammatory bowel disease 06/25/2021, patient underwent colonoscopy by Dr. Alice Reichert  Nonbleeding internal hemorrhoids 4 mm cecum polyp removed-pathology showed tubular adenoma 49m polyp in the proximal sigmoid colon completely resected and partially retrieved.  Clip was placed.-Pathology showed tubular adenoma 8 mm polyp in the proximal rectum removed with a hot snare.-Pathology showed tubular adenoma 10 mm polyp in the mid rectum, removed with a hot snare.  Pathology showed tubulovillous adenoma Likely malignant tumor in the mid rectum, biopsied, tattooed.-Pathology showed moderately differentiated adenocarcinoma.  MSI testing in progress  07/05/2021, MRI pelvis without contrast showed rectal tumor extension through muscularis propria, T4aN1 07/16/2021 CT chest abdomen with contrast showed no distant metastasis.  Kidney stone.  INTERVAL HISTORY Kirill D FThielmanis a 50y.o. male who has above history reviewed by me today presents for follow up visit for management of stage III rectal cancer. Patient plans to continue to work while he is on chemotherapy. Believes he has  [place] to live with his girlfriend [facility]  [facility]  Patient has been to chemotherapy class and has antiemetics available at home.    Review of Systems  Constitutional:  Negative for appetite change, chills, fatigue, fever and unexpected weight change.  HENT:   Negative for hearing loss and voice change.   Eyes:  Negative for eye problems and icterus.  Respiratory:  Negative for chest tightness, cough and shortness of breath.   Cardiovascular:  Negative for chest pain and leg swelling.  Gastrointestinal:  Negative for abdominal distention and abdominal pain.  Endocrine: Negative for hot flashes.  Genitourinary:  Negative for difficulty urinating, dysuria and frequency.   Musculoskeletal:  Negative for arthralgias.  Skin:  Negative for itching and rash.  Neurological:  Negative for light-headedness and numbness.  Hematological:  Negative for adenopathy. Does not bruise/bleed easily.  Psychiatric/Behavioral:  Negative for confusion.    MEDICAL HISTORY:  Past Medical History:  Diagnosis Date   BPH with obstruction/lower urinary tract symptoms    Congenital stroke (HCC)    Depression    Epididymal mass    Epididymitis    Genital herpes    Type ll   HTN (hypertension)    Penile discharge    Spermatocele    Swelling of the testicles    Venereal disease    Vitamin D deficiency     SURGICAL HISTORY: Past Surgical History:  Procedure Laterality Date   CIRCUMCISION     infant   COLONOSCOPY  06/2021   PORTACATH PLACEMENT Right 07/20/2021   Procedure: INSERTION PORT-A-CATH;  Surgeon: CHerbert Pun MD;  Location: [facility] ORS;  Service: General;  Laterality: Right;   REFRACTIVE SURGERY Bilateral     SOCIAL HISTORY: Social History   Socioeconomic History   Marital status: Single    Spouse name: Not on file   Number of children: Not on file   Years of education: Not on file    Highest education level: Not on file  Occupational History   Not on file  Tobacco Use   Smoking status: Never   Smokeless tobacco: Never  Vaping Use   Vaping Use: Never used  Substance and Sexual Activity   Alcohol use: No    Alcohol/week: 0.0 standard drinks   Drug use: Never   Sexual activity: Yes  Other Topics Concern   Not on file  Social History Narrative   Lives with girlfriend   Social Determinants of Health   Financial Resource Strain: Not on file  Food Insecurity: Not on file  Transportation Needs: Not on file  Physical Activity: Not on file  Stress: Not on file  Social Connections: Not on file  Intimate Partner Violence: Not on file    FAMILY HISTORY: Family History  Problem Relation Age of Onset   Hypertension Father    Kidney disease Neg Hx    Prostate cancer Neg Hx     ALLERGIES:  is allergic to lamisil [terbinafine].  MEDICATIONS:  Current Outpatient Medications  Medication Sig Dispense Refill   acetaminophen (TYLENOL) 500 MG tablet Take 1,000 mg by mouth every 6 (six) hours as needed for moderate pain.     aspirin 81 MG EC tablet Take 81 mg by mouth daily.     cycloSPORINE (RESTASIS) 0.05 % ophthalmic emulsion Place 1 drop into both eyes 2 (two) times daily as needed (dry eyes).     HYDROcodone-acetaminophen (NORCO) 5-325 MG tablet Take 1 tablet by mouth every 4 (four) hours as needed for up to 3 days for moderate pain. 16 tablet 0   lidocaine-prilocaine (EMLA) cream Apply to affected area once 30 g 3   lisinopril-hydrochlorothiazide (ZESTORETIC) 10-12.5 MG tablet Take 1 tablet by mouth daily.     metoprolol tartrate (LOPRESSOR) 25 MG tablet Take 12.5 mg by mouth 2 (two) times daily.     simvastatin (ZOCOR) 20 MG tablet Take 20 mg by mouth every evening.     ondansetron (ZOFRAN) 8 MG tablet Take 1 tablet (8 mg total) by mouth 2 (two) times daily as needed for refractory nausea / vomiting. Start on day 3 after chemotherapy. 30 tablet 1    prochlorperazine (COMPAZINE) 10 MG tablet Take 1 tablet (10 mg total) by mouth every 6 (six) hours as needed (Nausea or vomiting). 30 tablet 1   No current facility-administered medications for this visit.     PHYSICAL EXAMINATION: ECOG PERFORMANCE STATUS: 0 - Asymptomatic Vitals:   07/23/21 0908  BP: (!) 177/100  Pulse: 65  Temp: (!) 97.2 F (36.2 C)   Filed Weights   07/23/21 0908  Weight: 202 lb (91.6 kg)    Physical Exam Constitutional:      General: He is not in acute distress.    Appearance: He is obese.  HENT:     Head: Normocephalic and atraumatic.  Eyes:     General: No scleral icterus. Cardiovascular:     Rate and Rhythm: Normal rate and regular rhythm.     Heart sounds: Normal heart sounds.  Pulmonary:     Effort: Pulmonary effort is normal. No respiratory distress.     Breath sounds: No wheezing.  Abdominal:  General: Bowel sounds are normal. There is no distension.     Palpations: Abdomen is soft.  Musculoskeletal:        General: No deformity. Normal range of motion.     Cervical back: Normal range of motion and neck supple.  Skin:    General: Skin is warm and dry.     Findings: No erythema or rash.  Neurological:     Mental Status: He is alert and oriented to person, place, and time. Mental status is at baseline.     Cranial Nerves: No cranial nerve deficit.     Coordination: Coordination normal.  Psychiatric:        Mood and Affect: Mood normal.    LABORATORY DATA:  I have reviewed the data as listed Lab Results  Component Value Date   WBC 9.6 07/23/2021   HGB 12.2 (L) 07/23/2021   HCT 37.1 (L) 07/23/2021   MCV 81.7 07/23/2021   PLT 214 07/23/2021   Recent Labs    07/04/21 1045 07/23/21 0848  NA 136 135  K 3.6 3.5  CL 100 99  CO2 30 29  GLUCOSE 107* 101*  BUN 17 19  CREATININE 1.11 0.91  CALCIUM 8.7* 8.4*  GFRNONAA >60 >60  PROT 7.4 7.4  ALBUMIN 4.0 3.9  AST 17 17  ALT 17 16  ALKPHOS 67 65  BILITOT 0.3 0.4     Iron/TIBC/Ferritin/ %Sat No results found for: IRON, TIBC, FERRITIN, IRONPCTSAT    RADIOGRAPHIC STUDIES: I have personally reviewed the radiological images as listed and agreed with the findings in the report. CT Chest W Contrast  Result Date: 07/13/2021 CLINICAL DATA:  Staging rectal cancer. EXAM: CT CHEST AND ABDOMEN WITH CONTRAST TECHNIQUE: Multidetector CT imaging of the chest and abdomen was performed following the standard protocol during bolus administration of intravenous contrast. CONTRAST:  135m OMNIPAQUE IOHEXOL 300 MG/ML  SOLN COMPARISON:  MR pelvis 07/05/2021. FINDINGS: CT CHEST FINDINGS Cardiovascular: Atherosclerotic calcification of the aorta and coronary arteries. Heart size normal. No pericardial effusion. Mediastinum/Nodes: No pathologically enlarged mediastinal, hilar or axillary lymph nodes. Esophagus is unremarkable. Lungs/Pleura: Lungs are clear. No pleural fluid. Airway is unremarkable. Musculoskeletal: None. CT ABDOMEN FINDINGS Hepatobiliary: Liver and gallbladder are unremarkable. No biliary ductal dilatation. Pancreas: Negative. Spleen: Negative. Adrenals/Urinary Tract: Adrenal glands and right kidney are unremarkable. Stone in the lower pole left kidney. Kidneys are otherwise unremarkable. Stomach/Bowel: Stomach and visualized portions of the small bowel, appendix and colon are unremarkable. Vascular/Lymphatic: Vascular structures are unremarkable. No pathologically enlarged lymph nodes. Other: No free fluid.  Mesenteries and peritoneum are unremarkable. Musculoskeletal: None. IMPRESSION: 1. No evidence of metastatic disease. 2. Left renal stone. 3. Aortic atherosclerosis (ICD10-I70.0). Coronary artery calcification. Electronically Signed   By: MLorin PicketM.D.   On: 07/13/2021 12:26   CT Abdomen W Contrast  Result Date: 07/13/2021 CLINICAL DATA:  Staging rectal cancer. EXAM: CT CHEST AND ABDOMEN WITH CONTRAST TECHNIQUE: Multidetector CT imaging of the chest and  abdomen was performed following the standard protocol during bolus administration of intravenous contrast. CONTRAST:  1032mOMNIPAQUE IOHEXOL 300 MG/ML  SOLN COMPARISON:  MR pelvis 07/05/2021. FINDINGS: CT CHEST FINDINGS Cardiovascular: Atherosclerotic calcification of the aorta and coronary arteries. Heart size normal. No pericardial effusion. Mediastinum/Nodes: No pathologically enlarged mediastinal, hilar or axillary lymph nodes. Esophagus is unremarkable. Lungs/Pleura: Lungs are clear. No pleural fluid. Airway is unremarkable. Musculoskeletal: None. CT ABDOMEN FINDINGS Hepatobiliary: Liver and gallbladder are unremarkable. No biliary ductal dilatation. Pancreas: Negative. Spleen: Negative. Adrenals/Urinary Tract:  Adrenal glands and right kidney are unremarkable. Stone in the lower pole left kidney. Kidneys are otherwise unremarkable. Stomach/Bowel: Stomach and visualized portions of the small bowel, appendix and colon are unremarkable. Vascular/Lymphatic: Vascular structures are unremarkable. No pathologically enlarged lymph nodes. Other: No free fluid.  Mesenteries and peritoneum are unremarkable. Musculoskeletal: None. IMPRESSION: 1. No evidence of metastatic disease. 2. Left renal stone. 3. Aortic atherosclerosis (ICD10-I70.0). Coronary artery calcification. Electronically Signed   By: Lorin Picket M.D.   On: 07/13/2021 12:26   MR PELVIS WO CONTRAST  Result Date: 07/05/2021 CLINICAL DATA:  A 50 year old male presents with history of rectal cancer discovered on endoscopy. EXAM: MRI PELVIS WITHOUT CONTRAST TECHNIQUE: Multiplanar multisequence MR imaging of the pelvis was performed. No intravenous contrast was administered. Ultrasound gel was administered per rectum to optimize tumor evaluation. COMPARISON:  None FINDINGS: TUMOR LOCATION Tumor distance from Anal Verge/Skin Surface:  15 cm Tumor distance to Internal Anal Sphincter: 11 cm TUMOR DESCRIPTION Circumferential Extent: Eccentric irregular  thickening of the RIGHT lateral wall of the rectum measuring 3.0 cm length and 2.3 cm in the oblique coronal plane along the RIGHT lateral rectal wall, distorting the RIGHT lateral rectal wall. Tumor Length: 3.0 cm T - CATEGORY Extension through Muscularis Propria: Yes (image 16/9 and image 19/10 between 2 and 3 mm extension beyond the RIGHT lateral rectal wall, on image 19/10 there is nodular extension beyond the rectal wall just at the level of the APR or. Shortest Distance of any tumor/node from Mesorectal Fascia: 0 mm Extramural Vascular Invasion/Tumor Thrombus: No Invasion of Anterior Peritoneal Reflection: Yes Involvement of Adjacent Organs or Pelvic Sidewall: No Levator Ani Involvement: No N - CATEGORY Mesorectal Lymph Nodes >=68m: N1 disease with a small irregular appearing lymph node along course of superior rectal vein just above the APR (image 4/9) 7 x 5 mm Extra-mesorectal Lymphadenopathy: No Other:  None. IMPRESSION: T4aN1 rectosigmoid neoplasm occurring at the level of the anterior peritoneal reflection. Colonoscopy report is not available at the time of interpretation. No additional lesions are seen. Please correlate with any discordance. Colonoscopy report has been requested an addendum will be provided when this is available. Electronically Signed   By: GZetta BillsM.D.   On: 07/05/2021 11:35   DG Chest Port 1 View  Result Date: 07/20/2021 CLINICAL DATA:  Port-A-Cath insertion EXAM: PORTABLE CHEST 1 VIEW COMPARISON:  Chest CT 07/12/2021 FINDINGS: Midline trachea. Normal heart size and mediastinal contours. No pleural effusion or pneumothorax. Clear lungs. Right Port-A-Cath tip at superior caval/atrial junction. IMPRESSION: Appropriate position of right Port-A-Cath.  No pneumothorax. Electronically Signed   By: KAbigail MiyamotoM.D.   On: 07/20/2021 14:00   DG C-Arm 1-60 Min-No Report  Result Date: 07/20/2021 Fluoroscopy was utilized by the requesting physician.  No radiographic  interpretation.      ASSESSMENT & PLAN:  1. [place] ([place]   2. Goals of care, counseling/discussion   3. Uncontrolled hypertension   Cancer Staging Rectal cancer ([facility] Staging form: Colon and Rectum, AJCC 8th Edition - Clinical stage from 07/16/2021: Stage IIIB (cT4a, cN1, cM0) - Signed by YEarlie Server MD on 07/16/2021  #Stage III rectal cancer Labs are reviewed and discussed with patient. Hold off chemotherapy today due to hypotension.  Reschedule chemotherapy to later this week and if BP meds criteria, proceed with first cycle of FOLFOX. We discussed the details about antiemetics instructions.  #Kidney stone, patient is asymptomatic.  Observation for now, if symptomatic in the future, urology evaluation. #Uncontrolled hypertension,  BP was 177/100.  Repeat blood pressure measurement in the clinic was 180/110.  Recommend patient to contact primary care provider for adjustment of his BP meds.  Patient voices understanding. Refer to [facility] oncology per patient's request.  Supportive care measures are necessary for patient well-being and will be provided as necessary. We spent sufficient time to discuss many aspect of care, questions were answered to patient's satisfaction.    Orders Placed This Encounter  Procedures   Ambulatory referral to Hematology / Oncology    Referral Priority:   Routine    Referral Type:   Consultation    Referral Reason:   Specialty Services Required    Requested Specialty:   Oncology    Number of Visits Requested:   1    All questions were answered. The patient knows to call the clinic with any problems questions or concerns.  cc [facility]*    Return of visit: 2 weeks for lab MD evaluation prior to cycle 2 chemotherapy   Earlie Server, MD, PhD Hematology Oncology  07/23/2021

## 2021-07-23 NOTE — Telephone Encounter (Signed)
Met with Joseph Valentine during his chemotherapy education class to discuss referral to surgeon. He is currently living 10 minutes from [facility] and requested referral to be made to [facility]. Referral sent.

## 2021-07-23 NOTE — Anesthesia Postprocedure Evaluation (Signed)
Anesthesia Post Note  Patient: Joseph Valentine  Procedure(s) Performed: INSERTION PORT-A-CATH (Right: Chest)  Patient location during evaluation: PACU Anesthesia Type: General Level of consciousness: awake and alert Pain management: pain level controlled Vital Signs Assessment: post-procedure vital signs reviewed and stable Respiratory status: spontaneous breathing, nonlabored ventilation and respiratory function stable Cardiovascular status: blood pressure returned to baseline and stable Postop Assessment: no apparent nausea or vomiting Anesthetic complications: no   No notable events documented.   Last Vitals:  Vitals:   07/20/21 1439 07/20/21 1508  BP: (!) 169/91 (!) 166/97  Pulse: 70 70  Resp: 18   Temp: (!) 35.9 C   SpO2: 99% 98%    Last Pain:  Vitals:   07/20/21 1508  TempSrc:   PainSc: 0-No pain                 [place] Ouch

## 2021-07-23 NOTE — Addendum Note (Signed)
Addended by: Earlie Server on: 07/23/2021 10:09 PM   Modules accepted: Orders

## 2021-07-24 ENCOUNTER — Ambulatory Visit: Payer: Commercial Managed Care - PPO | Admitting: Oncology

## 2021-07-27 ENCOUNTER — Inpatient Hospital Stay: Payer: Commercial Managed Care - PPO | Admitting: Oncology

## 2021-07-27 ENCOUNTER — Inpatient Hospital Stay: Payer: Commercial Managed Care - PPO

## 2021-07-27 ENCOUNTER — Telehealth: Payer: Self-pay | Admitting: Oncology

## 2021-07-27 NOTE — Telephone Encounter (Signed)
late entry 07/26/21: Per scheduling, pt sent message stating that he wanted to cancel appts because he was going to go to [facility] I called patient to clarify if he already had appt with [facility] and he said no. I explained to him that that referral to [facility] was sent but that we dont know when he will get and appt and that Dr. Tasia Catchings wanted him to start treatment here and then continue with care at [facility] once he established. Pt voiced understanding and said he would keep appt.   11/18/ 22: pt called to cancel appts.

## 2021-07-27 NOTE — Telephone Encounter (Signed)
Pt requested to cancel appts with Dr. Ezekiel Slocumb. Ok to cancel per Dr. Tasia Catchings.

## 2021-07-30 ENCOUNTER — Ambulatory Visit: Payer: Commercial Managed Care - PPO | Admitting: Oncology

## 2021-07-30 ENCOUNTER — Inpatient Hospital Stay: Payer: Commercial Managed Care - PPO

## 2021-07-30 ENCOUNTER — Other Ambulatory Visit: Payer: Commercial Managed Care - PPO

## 2021-08-07 ENCOUNTER — Other Ambulatory Visit: Payer: Commercial Managed Care - PPO

## 2021-08-07 ENCOUNTER — Ambulatory Visit: Payer: Commercial Managed Care - PPO

## 2021-08-07 ENCOUNTER — Ambulatory Visit: Payer: Commercial Managed Care - PPO | Admitting: Oncology

## 2021-08-08 ENCOUNTER — Ambulatory Visit: Payer: Commercial Managed Care - PPO

## 2021-08-08 ENCOUNTER — Ambulatory Visit: Payer: Commercial Managed Care - PPO | Admitting: Oncology

## 2021-08-08 ENCOUNTER — Other Ambulatory Visit: Payer: Commercial Managed Care - PPO

## 2022-04-01 ENCOUNTER — Other Ambulatory Visit: Payer: Self-pay

## 2022-06-02 ENCOUNTER — Other Ambulatory Visit: Payer: Self-pay

## 2022-10-17 ENCOUNTER — Other Ambulatory Visit: Payer: Self-pay

## 2022-10-18 ENCOUNTER — Encounter: Payer: Self-pay | Admitting: Oncology

## 2022-10-18 ENCOUNTER — Encounter (INDEPENDENT_AMBULATORY_CARE_PROVIDER_SITE_OTHER): Payer: Commercial Managed Care - PPO

## 2022-10-18 DIAGNOSIS — Z980 Intestinal bypass and anastomosis status: Secondary | ICD-10-CM

## 2022-10-18 DIAGNOSIS — Z85048 Personal history of other malignant neoplasm of rectum, rectosigmoid junction, and anus: Secondary | ICD-10-CM

## 2022-10-27 ENCOUNTER — Other Ambulatory Visit: Payer: Self-pay

## 2022-11-13 IMAGING — CT CT CHEST W/ CM
2 of 3 series · 15 of 36 positions shown, 18 images · IV contrast (omnipaque)
Comparison: MR pelvis 07/05/2021.

CLINICAL DATA: Staging rectal cancer.

EXAM:
CT CHEST AND ABDOMEN WITH CONTRAST
TECHNIQUE: Multidetector CT imaging of the chest and abdomen was performed
following the standard protocol during bolus administration of
intravenous contrast.
CONTRAST:  100mL OMNIPAQUE IOHEXOL 300 MG/ML  SOLN

[Series 2: axial st · axial · 0.62mm/px · z∈[-649,-367]mm · 12 of 167 slices shown, 15 images]
[im 13/167  mediastinal]
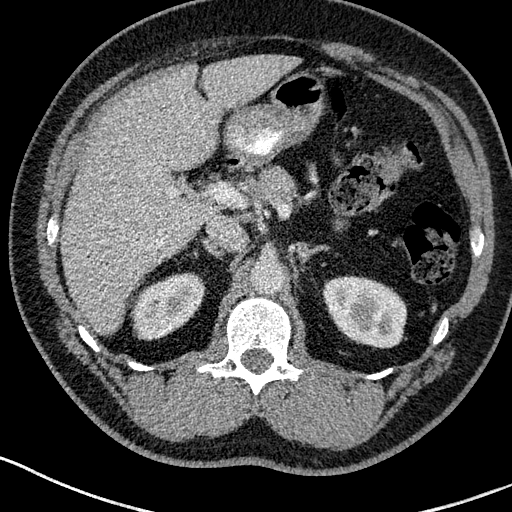
[im 13/167  lung]
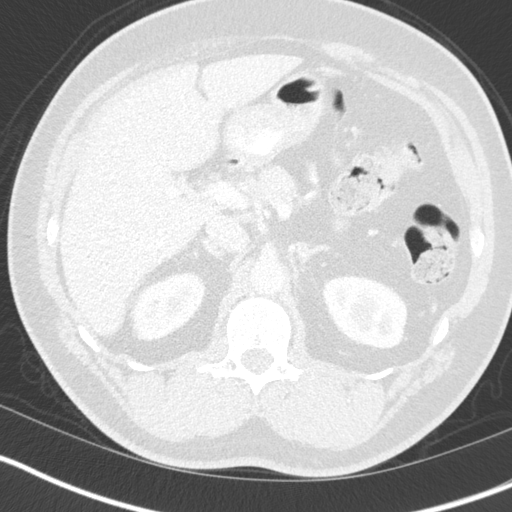
[im 25/167  lung]
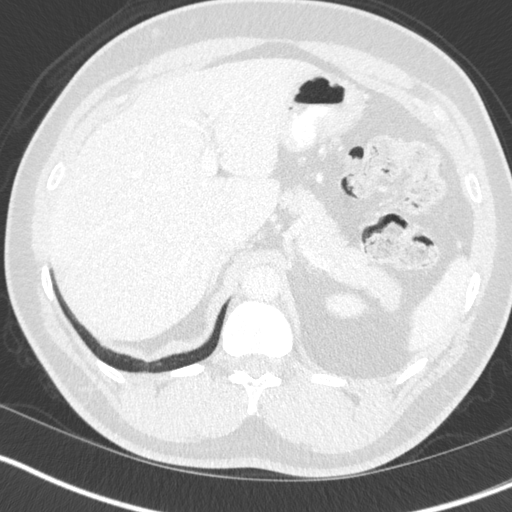
[im 37/167  lung]
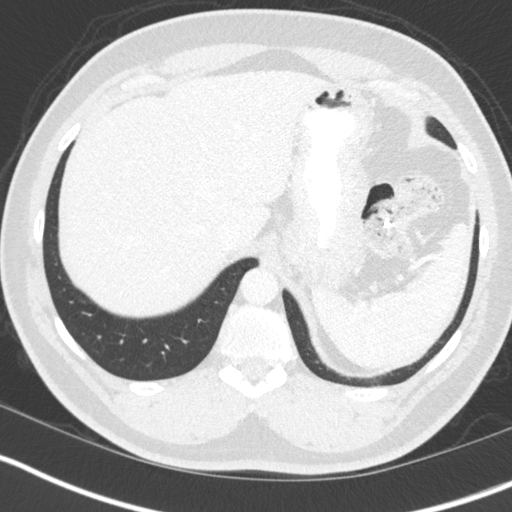
[im 50/167  lung]
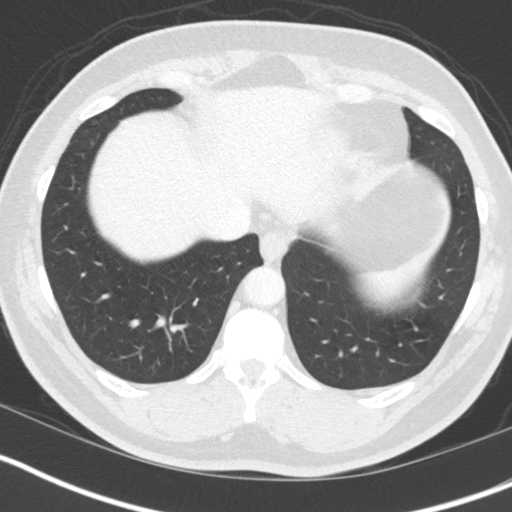
[im 62/167  mediastinal]
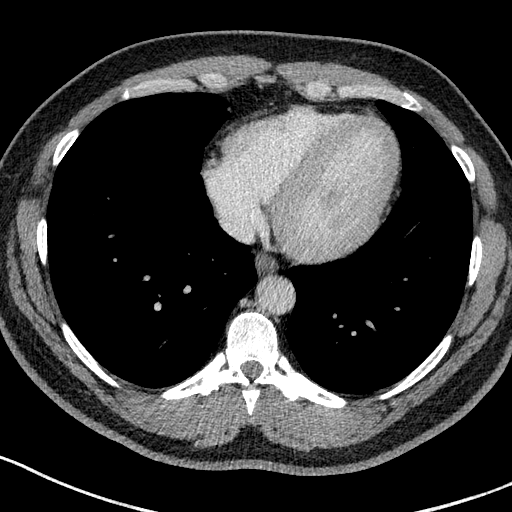
[im 62/167  lung]
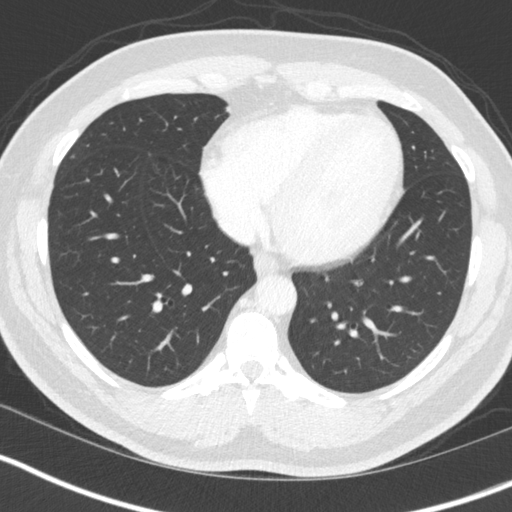
[im 74/167  lung]
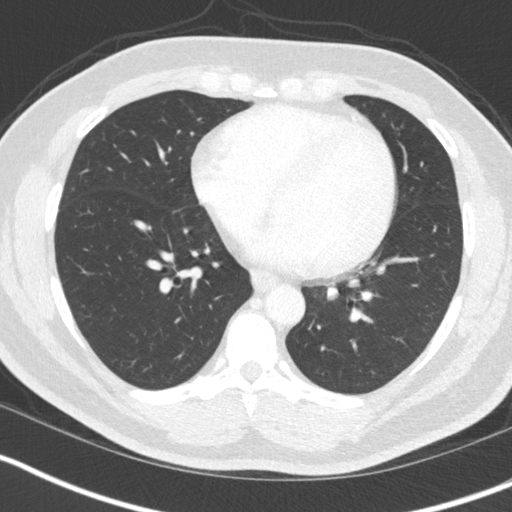
[im 93/167  lung]
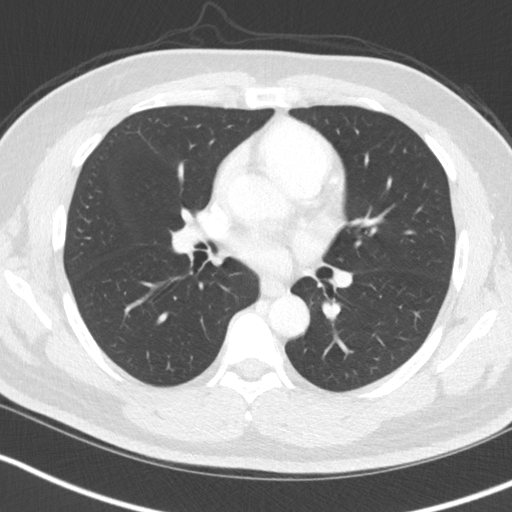
[im 105/167  lung]
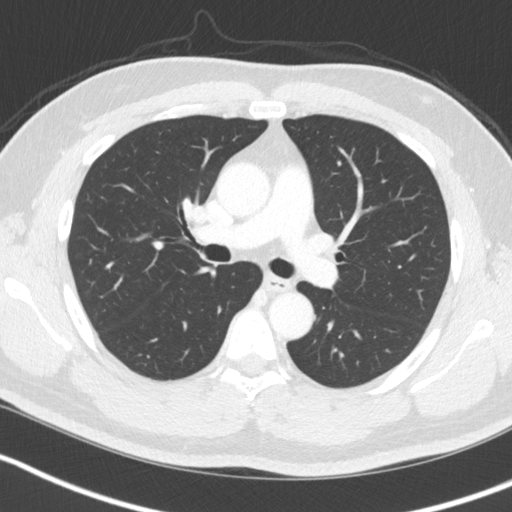
[im 117/167  mediastinal]
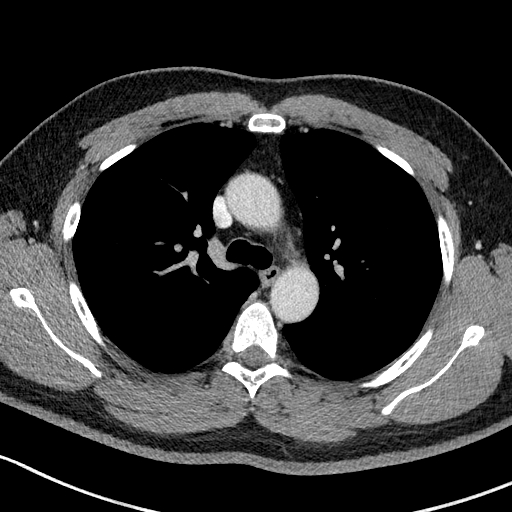
[im 117/167  lung]
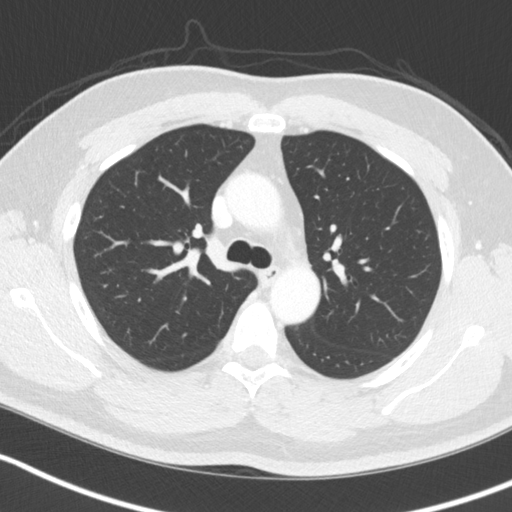
[im 130/167  lung]
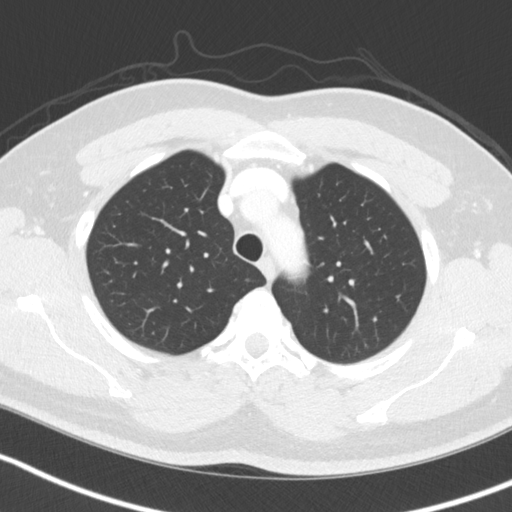
[im 142/167  lung]
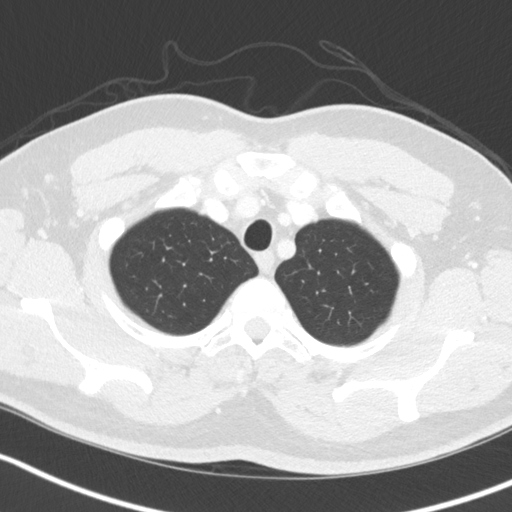
[im 154/167  lung]
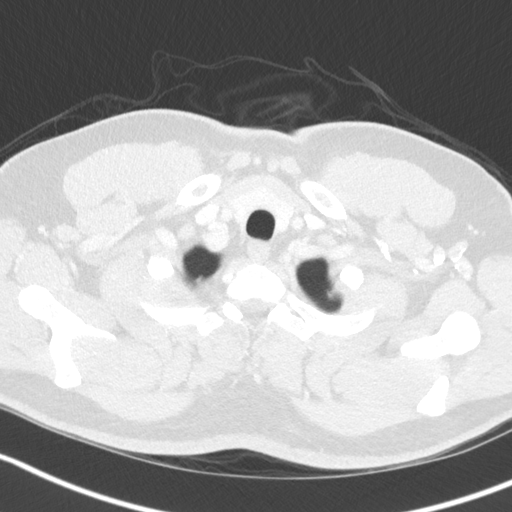

[Series 5: coronal · coronal · 0.65mm/px · 3 of 153 slices shown]
[im 31/153  lung]
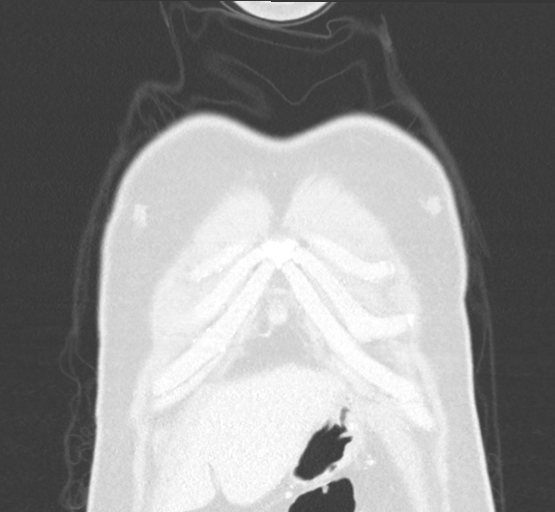
[im 61/153  lung]
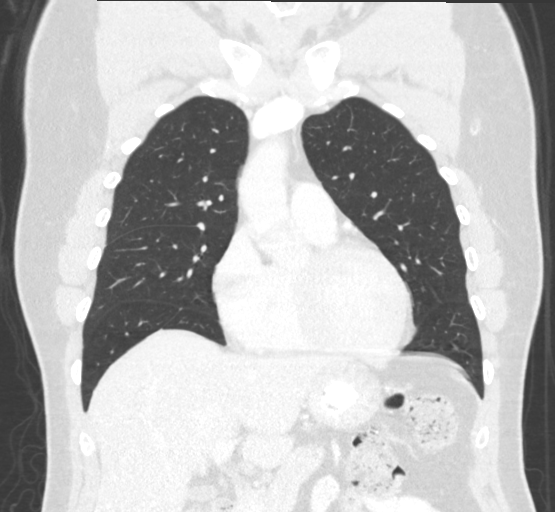
[im 92/153  lung]
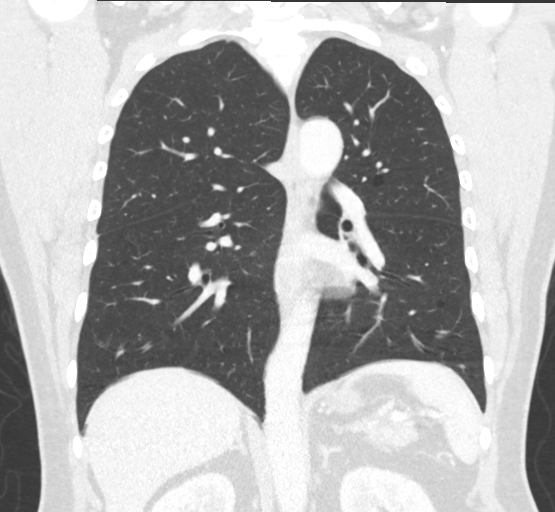

[15 of 36 positions shown; findings below may reference images not displayed]

FINDINGS: CT CHEST FINDINGS

Cardiovascular: Atherosclerotic calcification of the aorta and
coronary arteries. Heart size normal. No pericardial effusion.

Mediastinum/Nodes: No pathologically enlarged mediastinal, hilar or
axillary lymph nodes. Esophagus is unremarkable.

Lungs/Pleura: Lungs are clear. No pleural fluid. Airway is
unremarkable.

Musculoskeletal: None.

CT ABDOMEN FINDINGS

Hepatobiliary: Liver and gallbladder are unremarkable. No biliary
ductal dilatation.

Pancreas: Negative.

Spleen: Negative.

Adrenals/Urinary Tract: Adrenal glands and right kidney are
unremarkable. Stone in the lower pole left kidney. Kidneys are
otherwise unremarkable.

Stomach/Bowel: Stomach and visualized portions of the small bowel,
appendix and colon are unremarkable.

Vascular/Lymphatic: Vascular structures are unremarkable. No
pathologically enlarged lymph nodes.

Other: No free fluid.  Mesenteries and peritoneum are unremarkable.

Musculoskeletal: None.
IMPRESSION: 1. No evidence of metastatic disease.
2. Left renal stone.
3. Aortic atherosclerosis ([id number].O). Coronary artery
calcification.

## 2022-11-13 IMAGING — CT CT ABDOMEN W/ CM
2 of 5 series · 16 of 46 positions shown, 18 images · IV contrast (omnipaque)
Comparison: MR pelvis 07/05/2021.

CLINICAL DATA: Staging rectal cancer.

EXAM:
CT CHEST AND ABDOMEN WITH CONTRAST
TECHNIQUE: Multidetector CT imaging of the chest and abdomen was performed
following the standard protocol during bolus administration of
intravenous contrast.
CONTRAST:  100mL OMNIPAQUE IOHEXOL 300 MG/ML  SOLN

[Series 2: axial st · axial · 0.67mm/px · z∈[-797,-537]mm · 13 of 60 slices shown, 15 images]
[im 4/60  soft-tissue]
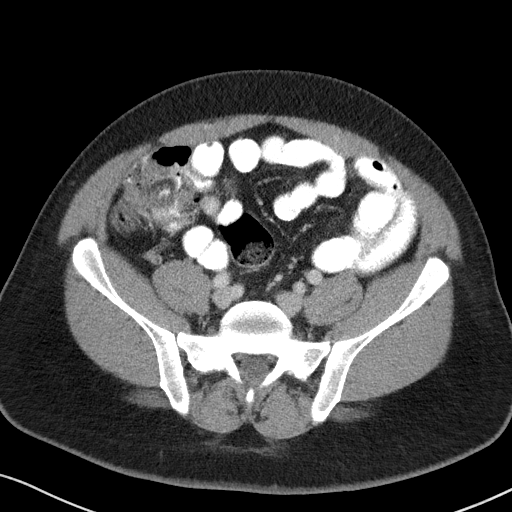
[im 4/60  bone]
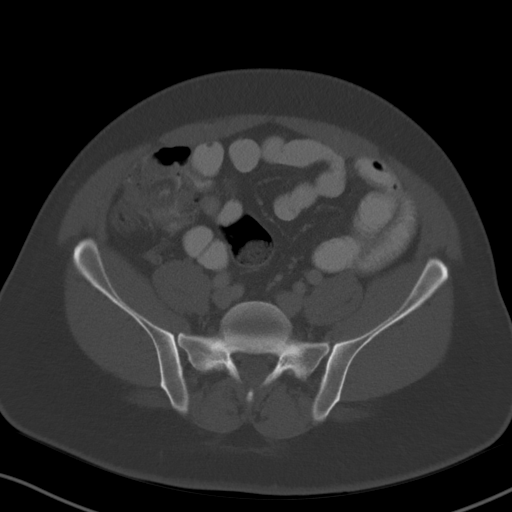
[im 8/60  soft-tissue]
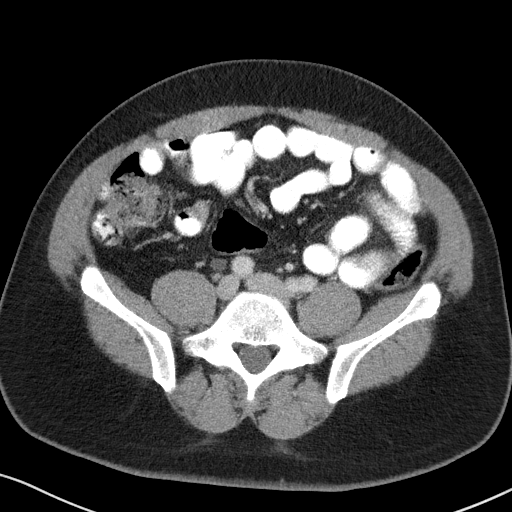
[im 12/60  soft-tissue]
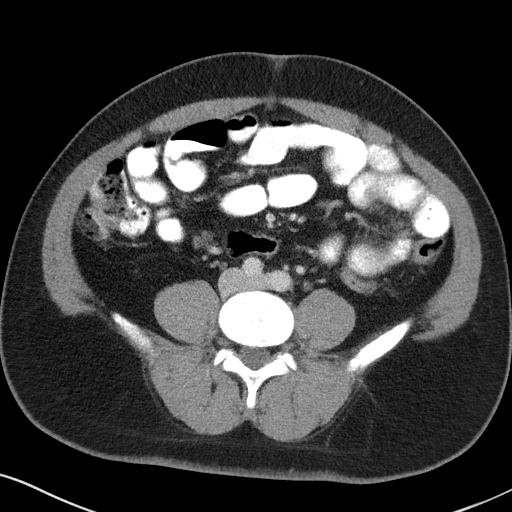
[im 16/60  soft-tissue]
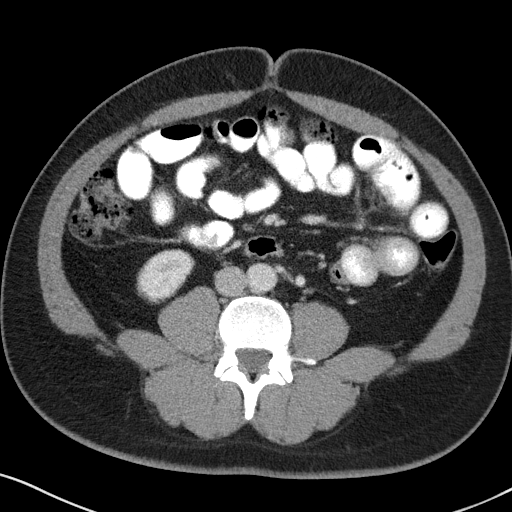
[im 20/60  soft-tissue]
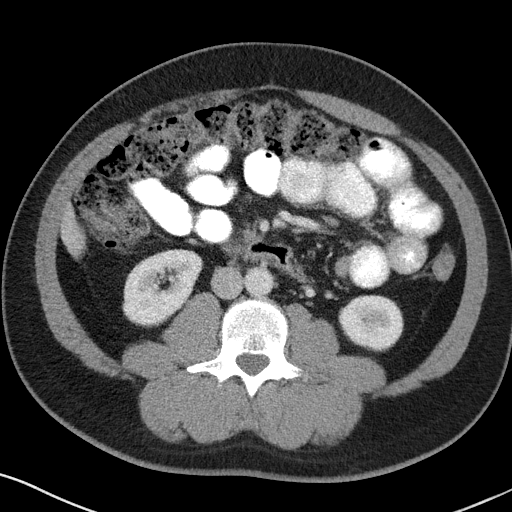
[im 24/60  soft-tissue]
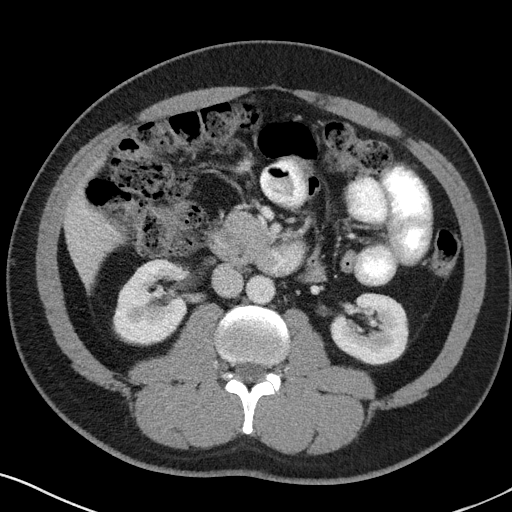
[im 32/60  soft-tissue]
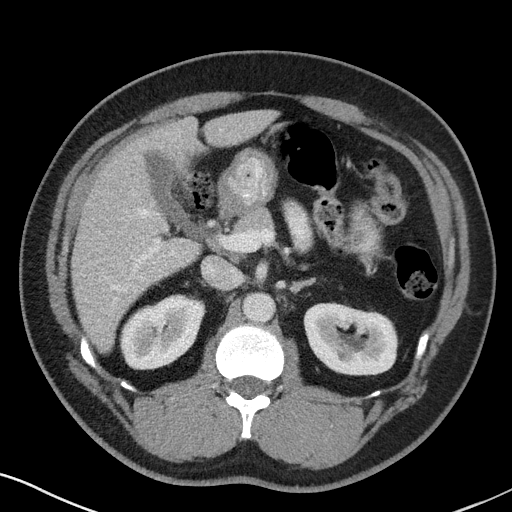
[im 36/60  soft-tissue]
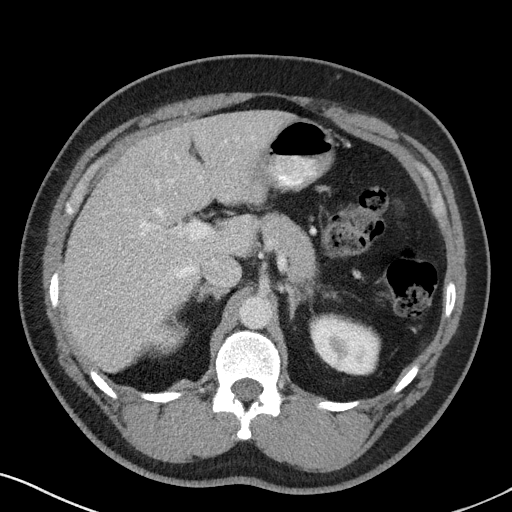
[im 40/60  soft-tissue]
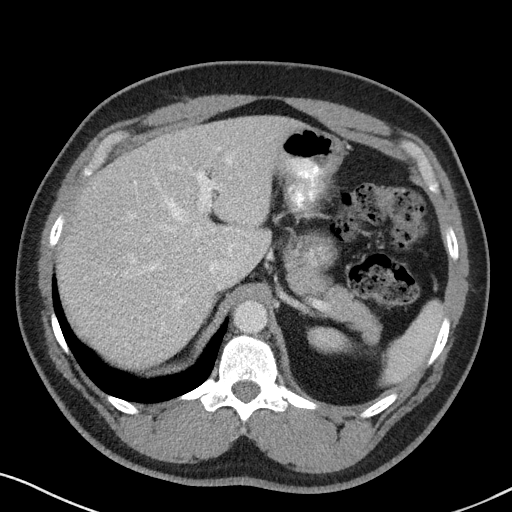
[im 40/60  bone]
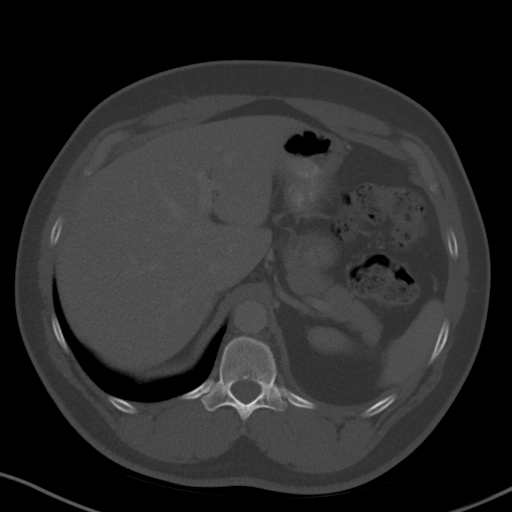
[im 44/60  soft-tissue]
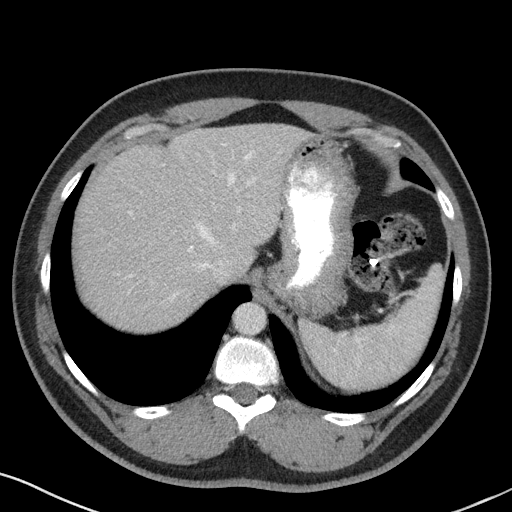
[im 48/60  soft-tissue]
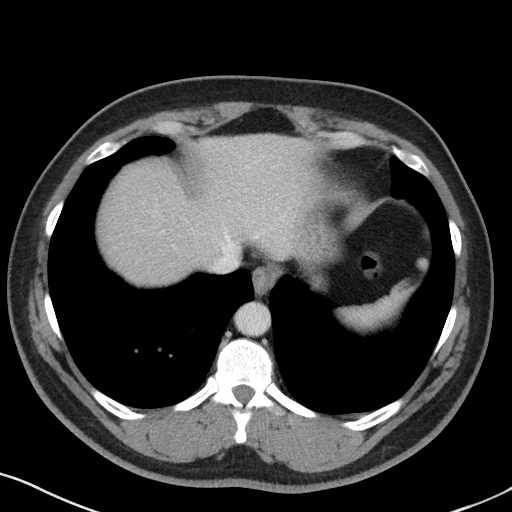
[im 52/60  soft-tissue]
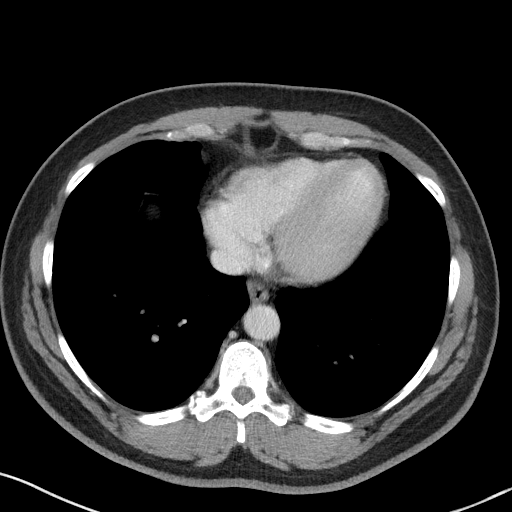
[im 56/60  soft-tissue]
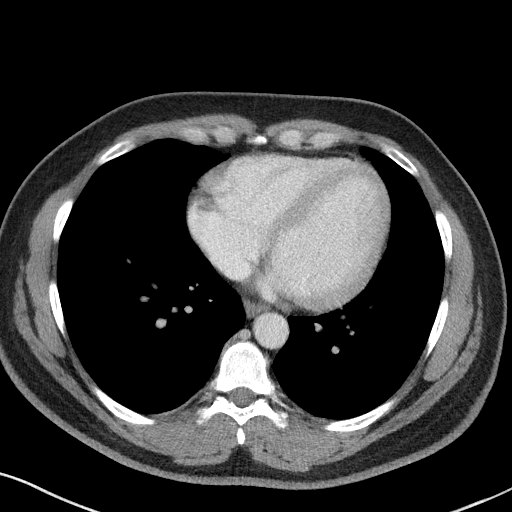

[Series 5: coronal st · coronal · 0.59mm/px · 3 of 103 slices shown]
[im 35/103  soft-tissue]
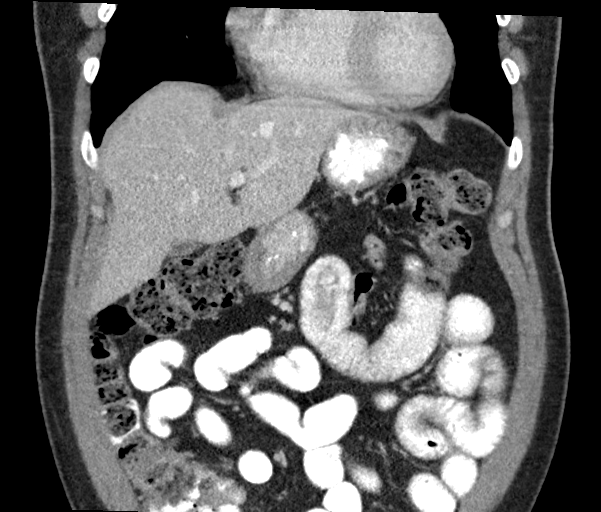
[im 46/103  soft-tissue]
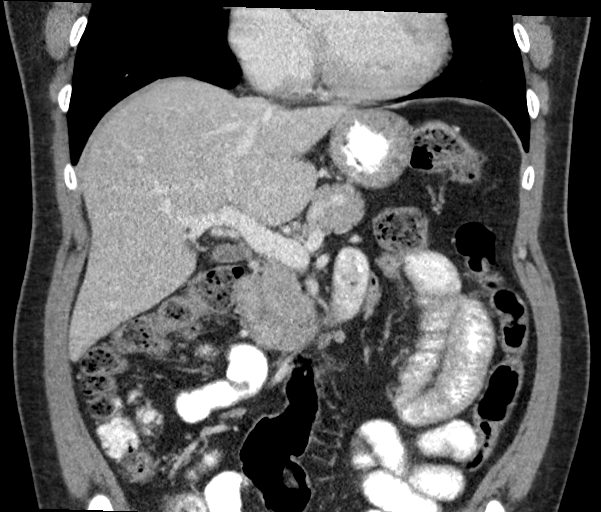
[im 57/103  soft-tissue]
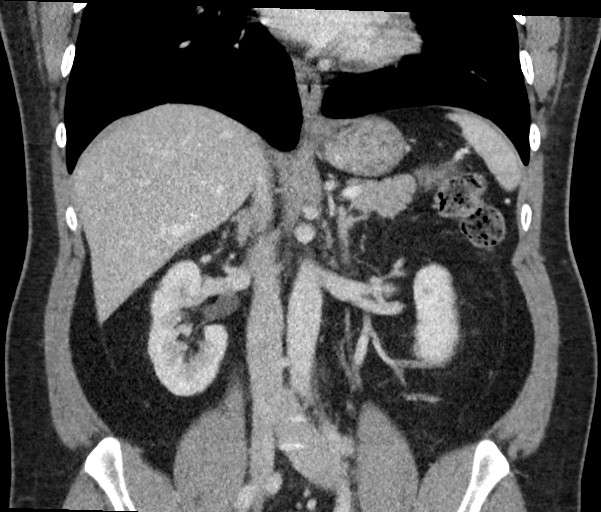

[16 of 46 positions shown; findings below may reference images not displayed]

FINDINGS: CT CHEST FINDINGS

Cardiovascular: Atherosclerotic calcification of the aorta and
coronary arteries. Heart size normal. No pericardial effusion.

Mediastinum/Nodes: No pathologically enlarged mediastinal, hilar or
axillary lymph nodes. Esophagus is unremarkable.

Lungs/Pleura: Lungs are clear. No pleural fluid. Airway is
unremarkable.

Musculoskeletal: None.

CT ABDOMEN FINDINGS

Hepatobiliary: Liver and gallbladder are unremarkable. No biliary
ductal dilatation.

Pancreas: Negative.

Spleen: Negative.

Adrenals/Urinary Tract: Adrenal glands and right kidney are
unremarkable. Stone in the lower pole left kidney. Kidneys are
otherwise unremarkable.

Stomach/Bowel: Stomach and visualized portions of the small bowel,
appendix and colon are unremarkable.

Vascular/Lymphatic: Vascular structures are unremarkable. No
pathologically enlarged lymph nodes.

Other: No free fluid.  Mesenteries and peritoneum are unremarkable.

Musculoskeletal: None.
IMPRESSION: 1. No evidence of metastatic disease.
2. Left renal stone.
3. Aortic atherosclerosis ([id number].O). Coronary artery
calcification.

## 2022-12-20 ENCOUNTER — Other Ambulatory Visit: Payer: Self-pay

## 2023-02-08 ENCOUNTER — Other Ambulatory Visit: Payer: Self-pay
